# Patient Record
Sex: Female | Born: 1945 | Race: White | Hispanic: No | Marital: Married | Smoking: Former smoker
Health system: Southern US, Community
[De-identification: ages and names within clinical notes are randomized; demographics above are authoritative.]

## PROBLEM LIST (undated history)

## (undated) DIAGNOSIS — N289 Disorder of kidney and ureter, unspecified: Secondary | ICD-10-CM

## (undated) DIAGNOSIS — K5790 Diverticulosis of intestine, part unspecified, without perforation or abscess without bleeding: Secondary | ICD-10-CM

## (undated) DIAGNOSIS — M549 Dorsalgia, unspecified: Secondary | ICD-10-CM

## (undated) DIAGNOSIS — D649 Anemia, unspecified: Secondary | ICD-10-CM

## (undated) DIAGNOSIS — G629 Polyneuropathy, unspecified: Secondary | ICD-10-CM

## (undated) DIAGNOSIS — E7800 Pure hypercholesterolemia, unspecified: Secondary | ICD-10-CM

## (undated) DIAGNOSIS — I10 Essential (primary) hypertension: Secondary | ICD-10-CM

## (undated) DIAGNOSIS — E538 Deficiency of other specified B group vitamins: Secondary | ICD-10-CM

## (undated) DIAGNOSIS — D569 Thalassemia, unspecified: Secondary | ICD-10-CM

## (undated) HISTORY — PX: VAGINAL HYSTERECTOMY: SUR661

## (undated) HISTORY — PX: CHOLECYSTECTOMY: SHX55

---

## 2002-01-25 ENCOUNTER — Encounter: Payer: Self-pay | Admitting: Obstetrics and Gynecology

## 2002-01-25 ENCOUNTER — Encounter: Admission: RE | Admit: 2002-01-25 | Discharge: 2002-01-25 | Payer: Self-pay | Admitting: Obstetrics and Gynecology

## 2002-01-27 ENCOUNTER — Other Ambulatory Visit: Admission: RE | Admit: 2002-01-27 | Discharge: 2002-01-27 | Payer: Self-pay | Admitting: Obstetrics and Gynecology

## 2002-02-08 ENCOUNTER — Ambulatory Visit (HOSPITAL_COMMUNITY): Admission: RE | Admit: 2002-02-08 | Discharge: 2002-02-08 | Payer: Self-pay | Admitting: Obstetrics and Gynecology

## 2003-02-03 ENCOUNTER — Ambulatory Visit (HOSPITAL_COMMUNITY): Admission: RE | Admit: 2003-02-03 | Discharge: 2003-02-03 | Payer: Self-pay | Admitting: Gastroenterology

## 2003-03-21 ENCOUNTER — Other Ambulatory Visit: Admission: RE | Admit: 2003-03-21 | Discharge: 2003-03-21 | Payer: Self-pay | Admitting: Obstetrics and Gynecology

## 2003-08-24 ENCOUNTER — Observation Stay (HOSPITAL_COMMUNITY): Admission: AD | Admit: 2003-08-24 | Discharge: 2003-08-25 | Payer: Self-pay | Admitting: Obstetrics and Gynecology

## 2004-04-05 ENCOUNTER — Other Ambulatory Visit: Admission: RE | Admit: 2004-04-05 | Discharge: 2004-04-05 | Payer: Self-pay | Admitting: Obstetrics and Gynecology

## 2004-05-16 ENCOUNTER — Encounter: Admission: RE | Admit: 2004-05-16 | Discharge: 2004-05-16 | Payer: Self-pay | Admitting: Geriatric Medicine

## 2005-06-02 ENCOUNTER — Other Ambulatory Visit: Admission: RE | Admit: 2005-06-02 | Discharge: 2005-06-02 | Payer: Self-pay | Admitting: Obstetrics and Gynecology

## 2005-08-02 ENCOUNTER — Emergency Department (HOSPITAL_COMMUNITY): Admission: EM | Admit: 2005-08-02 | Discharge: 2005-08-02 | Payer: Self-pay | Admitting: Emergency Medicine

## 2005-10-04 ENCOUNTER — Emergency Department (HOSPITAL_COMMUNITY): Admission: EM | Admit: 2005-10-04 | Discharge: 2005-10-04 | Payer: Self-pay | Admitting: Emergency Medicine

## 2007-03-04 ENCOUNTER — Encounter: Admission: RE | Admit: 2007-03-04 | Discharge: 2007-03-04 | Payer: Self-pay | Admitting: Family Medicine

## 2011-10-07 ENCOUNTER — Institutional Professional Consult (permissible substitution): Payer: Self-pay | Admitting: Internal Medicine

## 2012-01-08 ENCOUNTER — Other Ambulatory Visit: Payer: Self-pay | Admitting: Nephrology

## 2012-01-08 DIAGNOSIS — N189 Chronic kidney disease, unspecified: Secondary | ICD-10-CM

## 2012-01-14 ENCOUNTER — Other Ambulatory Visit: Payer: Self-pay

## 2012-01-19 ENCOUNTER — Ambulatory Visit
Admission: RE | Admit: 2012-01-19 | Discharge: 2012-01-19 | Disposition: A | Discharge status: Home / Self Care | Payer: BC Managed Care – PPO | Source: Ambulatory Visit | Attending: Nephrology | Admitting: Nephrology

## 2012-01-19 DIAGNOSIS — N189 Chronic kidney disease, unspecified: Secondary | ICD-10-CM

## 2012-02-16 ENCOUNTER — Other Ambulatory Visit: Payer: Self-pay | Admitting: Gastroenterology

## 2012-03-01 ENCOUNTER — Other Ambulatory Visit (HOSPITAL_COMMUNITY): Payer: Self-pay | Admitting: Gastroenterology

## 2012-03-01 DIAGNOSIS — Z86010 Personal history of colonic polyps: Secondary | ICD-10-CM

## 2012-03-29 ENCOUNTER — Ambulatory Visit (HOSPITAL_COMMUNITY)
Admission: RE | Admit: 2012-03-29 | Discharge: 2012-03-29 | Disposition: A | Discharge status: Home / Self Care | Payer: BC Managed Care – PPO | Source: Ambulatory Visit | Attending: Gastroenterology | Admitting: Gastroenterology

## 2012-03-29 DIAGNOSIS — K5730 Diverticulosis of large intestine without perforation or abscess without bleeding: Secondary | ICD-10-CM | POA: Insufficient documentation

## 2012-03-29 DIAGNOSIS — Z86010 Personal history of colonic polyps: Secondary | ICD-10-CM

## 2013-02-21 ENCOUNTER — Other Ambulatory Visit: Payer: Self-pay | Admitting: Chiropractic Medicine

## 2013-02-21 ENCOUNTER — Ambulatory Visit
Admission: RE | Admit: 2013-02-21 | Discharge: 2013-02-21 | Disposition: A | Discharge status: Home / Self Care | Payer: BC Managed Care – PPO | Source: Ambulatory Visit | Attending: Chiropractic Medicine | Admitting: Chiropractic Medicine

## 2013-02-21 DIAGNOSIS — M545 Low back pain, unspecified: Secondary | ICD-10-CM

## 2013-02-21 DIAGNOSIS — M542 Cervicalgia: Secondary | ICD-10-CM

## 2014-02-17 ENCOUNTER — Other Ambulatory Visit: Payer: Self-pay | Admitting: Orthopaedic Surgery

## 2014-02-17 DIAGNOSIS — M545 Low back pain, unspecified: Secondary | ICD-10-CM

## 2014-02-22 ENCOUNTER — Ambulatory Visit
Admission: RE | Admit: 2014-02-22 | Discharge: 2014-02-22 | Disposition: A | Discharge status: Home / Self Care | Payer: Medicare HMO | Source: Ambulatory Visit | Attending: Orthopaedic Surgery | Admitting: Orthopaedic Surgery

## 2014-02-22 DIAGNOSIS — M545 Low back pain, unspecified: Secondary | ICD-10-CM

## 2014-05-05 ENCOUNTER — Encounter (HOSPITAL_COMMUNITY): Payer: Self-pay | Admitting: Emergency Medicine

## 2014-05-05 ENCOUNTER — Emergency Department (HOSPITAL_COMMUNITY)
Admission: EM | Admit: 2014-05-05 | Discharge: 2014-05-05 | Disposition: A | Discharge status: Home / Self Care | Payer: Medicare HMO | Attending: Emergency Medicine | Admitting: Emergency Medicine

## 2014-05-05 DIAGNOSIS — Z8719 Personal history of other diseases of the digestive system: Secondary | ICD-10-CM | POA: Insufficient documentation

## 2014-05-05 DIAGNOSIS — Z862 Personal history of diseases of the blood and blood-forming organs and certain disorders involving the immune mechanism: Secondary | ICD-10-CM | POA: Insufficient documentation

## 2014-05-05 DIAGNOSIS — M79609 Pain in unspecified limb: Secondary | ICD-10-CM

## 2014-05-05 DIAGNOSIS — G608 Other hereditary and idiopathic neuropathies: Secondary | ICD-10-CM | POA: Insufficient documentation

## 2014-05-05 DIAGNOSIS — R011 Cardiac murmur, unspecified: Secondary | ICD-10-CM | POA: Insufficient documentation

## 2014-05-05 DIAGNOSIS — Z8639 Personal history of other endocrine, nutritional and metabolic disease: Secondary | ICD-10-CM | POA: Insufficient documentation

## 2014-05-05 DIAGNOSIS — I82811 Embolism and thrombosis of superficial veins of right lower extremities: Secondary | ICD-10-CM

## 2014-05-05 DIAGNOSIS — I824Z9 Acute embolism and thrombosis of unspecified deep veins of unspecified distal lower extremity: Secondary | ICD-10-CM | POA: Insufficient documentation

## 2014-05-05 DIAGNOSIS — Z87448 Personal history of other diseases of urinary system: Secondary | ICD-10-CM | POA: Insufficient documentation

## 2014-05-05 DIAGNOSIS — I10 Essential (primary) hypertension: Secondary | ICD-10-CM | POA: Insufficient documentation

## 2014-05-05 HISTORY — DX: Thalassemia, unspecified: D56.9

## 2014-05-05 HISTORY — DX: Disorder of kidney and ureter, unspecified: N28.9

## 2014-05-05 HISTORY — DX: Anemia, unspecified: D64.9

## 2014-05-05 HISTORY — DX: Diverticulosis of intestine, part unspecified, without perforation or abscess without bleeding: K57.90

## 2014-05-05 HISTORY — DX: Pure hypercholesterolemia, unspecified: E78.00

## 2014-05-05 HISTORY — DX: Dorsalgia, unspecified: M54.9

## 2014-05-05 HISTORY — DX: Deficiency of other specified B group vitamins: E53.8

## 2014-05-05 HISTORY — DX: Polyneuropathy, unspecified: G62.9

## 2014-05-05 HISTORY — DX: Essential (primary) hypertension: I10

## 2014-05-05 LAB — CBC WITH DIFFERENTIAL/PLATELET
BASOS ABS: 0 10*3/uL (ref 0.0–0.1)
Basophils Relative: 0 % (ref 0–1)
EOS ABS: 0.3 10*3/uL (ref 0.0–0.7)
Eosinophils Relative: 4 % (ref 0–5)
HCT: 28.1 % — ABNORMAL LOW (ref 36.0–46.0)
Hemoglobin: 8.8 g/dL — ABNORMAL LOW (ref 12.0–15.0)
LYMPHS ABS: 2.6 10*3/uL (ref 0.7–4.0)
Lymphocytes Relative: 34 % (ref 12–46)
MCH: 18.6 pg — AB (ref 26.0–34.0)
MCHC: 31.3 g/dL (ref 30.0–36.0)
MCV: 59.5 fL — AB (ref 78.0–100.0)
MONO ABS: 0.4 10*3/uL (ref 0.1–1.0)
Monocytes Relative: 5 % (ref 3–12)
NEUTROS PCT: 57 % (ref 43–77)
Neutro Abs: 4.4 10*3/uL (ref 1.7–7.7)
PLATELETS: 255 10*3/uL (ref 150–400)
RBC: 4.72 MIL/uL (ref 3.87–5.11)
RDW: 16.5 % — AB (ref 11.5–15.5)
WBC: 7.7 10*3/uL (ref 4.0–10.5)

## 2014-05-05 LAB — BASIC METABOLIC PANEL
BUN: 39 mg/dL — AB (ref 6–23)
CO2: 22 mEq/L (ref 19–32)
CREATININE: 1.77 mg/dL — AB (ref 0.50–1.10)
Calcium: 9.8 mg/dL (ref 8.4–10.5)
Chloride: 105 mEq/L (ref 96–112)
GFR, EST AFRICAN AMERICAN: 33 mL/min — AB (ref >90)
GFR, EST NON AFRICAN AMERICAN: 29 mL/min — AB (ref >90)
GLUCOSE: 133 mg/dL — AB (ref 70–99)
Potassium: 4.4 mEq/L (ref 3.7–5.3)
Sodium: 142 mEq/L (ref 137–147)

## 2014-05-05 LAB — PROTIME-INR
INR: 1.06 (ref 0.00–1.49)
PROTHROMBIN TIME: 13.6 s (ref 11.6–15.2)

## 2014-05-05 NOTE — ED Notes (Signed)
She has felt pain to L inner calf since yesterday, then this am she noticed redness to the area and she wants to be checked for a blood clot

## 2014-05-05 NOTE — Discharge Instructions (Signed)
Phlebitis  Phlebitis is soreness and puffiness (swelling) in a vein.   HOME CARE  · Only take medicine as told by your doctor.  · Raise (elevate) the affected limb on a pillow as told by your doctor.  · Keep a warm packs on the affected vein as told by your doctor. Do not sleep with a heating pad.  · Use special stockings or bandages around the area of the affected vein as told by your doctor. These will speed healing and keep the condition from coming back.  · Talk to your doctor about all the medicines you take.  · Get follow-up blood tests as told by your doctor.  · If the phlebitis is in your legs:  · Avoid standing or resting for long periods.  · Keep your legs moving. Raise your legs when you sit or lie.  · Do not smoke.  · Follow-up with your doctor as told.  GET HELP IF:  · You have strange bruises or bleeding.  · Your puffiness or pain in the affected area is not getting better.  · You are taking special medicine to lessen puffiness (anti-inflammatory medicine) , and you get belly pain.  GET HELP RIGHT AWAY IF:   · The phlebitis gets worse and you have more pain, puffiness (swelling), or redness.  · You have trouble breathing or have chest pain.  · You have a fever.  MAKE SURE YOU:   · Understand these instructions.  · Will watch your condition.  · Will get help right away if you are not doing well or get worse.  Document Released: 11/26/2009 Document Revised: 09/28/2013 Document Reviewed: 08/15/2013  [employer]® Patient Information ©2014 [employer].

## 2014-05-05 NOTE — Progress Notes (Signed)
VASCULAR LAB PRELIMINARY  PRELIMINARY  PRELIMINARY  PRELIMINARY  Right lower extremity venous duplex completed.    Preliminary report:  Superficial thrombosis noted in a branch of the GSV at the area of redness/tenderness.  No evidence of DVT     Gara KronerHelene F Yemaya Barnier, RVT 05/05/2014, 2:26 PM

## 2014-05-05 NOTE — ED Provider Notes (Signed)
CSN: [id number]     Arrival date & time 05/05/14  1139 History   First MD Initiated Contact with Patient 05/05/14 1154     Chief Complaint  Patient presents with  . Leg Pain     (Consider location/radiation/quality/duration/timing/severity/associated sxs/prior Treatment) HPI 68 year old female with reddened, tender area to right medial calf. She states it began yesterday. She has not noted that it has spread. She does not know of any trauma or insect bites. She has not had any similar episodes in the past. She denies any history of DVT into any chest pain or shortness of breath. She has not treated it in anyway and comes in stating that she initially noted pain there. Past Medical History  Diagnosis Date  . Hypertension   . Hypercholesteremia   . Peripheral neuropathy   . Diverticulosis   . Anemia   . Renal disorder   . Thalassemia   . Vitamin B 12 deficiency   . Back pain    History reviewed. No pertinent past surgical history. History reviewed. No pertinent family history. History  Substance Use Topics  . Smoking status: Never Smoker   . Smokeless tobacco: Not on file  . Alcohol Use: No   OB History   Grav Para Term Preterm Abortions TAB SAB Ect Mult Living                 Review of Systems  All other systems reviewed and are negative.     Allergies  Review of patient's allergies indicates no known allergies.  Home Medications   Prior to Admission medications   Not on File   BP 128/61  Pulse 78  Temp(Src) 97.9 F (36.6 C) (Oral)  Resp 20  SpO2 99% Physical Exam  Nursing note and vitals reviewed. Constitutional: She appears well-developed and well-nourished.  HENT:  Head: Normocephalic and atraumatic.  Right Ear: External ear normal.  Left Ear: External ear normal.  Nose: Nose normal.  Mouth/Throat: Oropharynx is clear and moist.  Eyes: Conjunctivae are normal. Pupils are equal, round, and reactive to light.  Neck: Normal range of motion. Neck  supple.  Cardiovascular: Normal rate and regular rhythm.   Murmur heard. Pulmonary/Chest: Effort normal and breath sounds normal.  Abdominal: Soft. Bowel sounds are normal.  Musculoskeletal: She exhibits tenderness. She exhibits no edema.       Legs:   ED Course  Procedures (including critical care time) Labs Review Labs Reviewed  CBC WITH DIFFERENTIAL - Abnormal; Notable for the following:    Hemoglobin 8.8 (*)    HCT 28.1 (*)    MCV 59.5 (*)    MCH 18.6 (*)    RDW 16.5 (*)    All other components within normal limits  BASIC METABOLIC PANEL - Abnormal; Notable for the following:    Glucose, Bld 133 (*)    BUN 39 (*)    Creatinine, Ser 1.77 (*)    GFR calc non Af Amer 29 (*)    GFR calc Af Amer 33 (*)    All other components within normal limits  PROTIME-INR    Imaging Review No results found.   EKG Interpretation None      MDM   Final diagnoses:  Superficial thrombosis of right lower extremity    Superficial vein thrombosis of branch of GSV with no dvt.  Plan treatment with warm compresses, elevation and close follow up for repeat doppler.  Anemia- discussed with patient - states near baseline and will follow up with  pmd  Hilario Quarryanielle S Iver Fehrenbach, MD 05/06/14 [phone]

## 2014-06-24 ENCOUNTER — Encounter (HOSPITAL_COMMUNITY): Payer: Self-pay | Admitting: Emergency Medicine

## 2014-06-24 ENCOUNTER — Emergency Department (HOSPITAL_COMMUNITY)
Admission: EM | Admit: 2014-06-24 | Discharge: 2014-06-24 | Disposition: A | Discharge status: Home / Self Care | Payer: Medicare HMO | Attending: Emergency Medicine | Admitting: Emergency Medicine

## 2014-06-24 DIAGNOSIS — Z79899 Other long term (current) drug therapy: Secondary | ICD-10-CM | POA: Insufficient documentation

## 2014-06-24 DIAGNOSIS — N289 Disorder of kidney and ureter, unspecified: Secondary | ICD-10-CM | POA: Insufficient documentation

## 2014-06-24 DIAGNOSIS — G609 Hereditary and idiopathic neuropathy, unspecified: Secondary | ICD-10-CM | POA: Insufficient documentation

## 2014-06-24 DIAGNOSIS — T7840XA Allergy, unspecified, initial encounter: Secondary | ICD-10-CM

## 2014-06-24 DIAGNOSIS — Z8719 Personal history of other diseases of the digestive system: Secondary | ICD-10-CM | POA: Insufficient documentation

## 2014-06-24 DIAGNOSIS — E7800 Pure hypercholesterolemia, unspecified: Secondary | ICD-10-CM | POA: Insufficient documentation

## 2014-06-24 DIAGNOSIS — Z862 Personal history of diseases of the blood and blood-forming organs and certain disorders involving the immune mechanism: Secondary | ICD-10-CM | POA: Insufficient documentation

## 2014-06-24 DIAGNOSIS — L272 Dermatitis due to ingested food: Secondary | ICD-10-CM | POA: Insufficient documentation

## 2014-06-24 DIAGNOSIS — I10 Essential (primary) hypertension: Secondary | ICD-10-CM | POA: Insufficient documentation

## 2014-06-24 MED ORDER — FAMOTIDINE IN NACL 20-0.9 MG/50ML-% IV SOLN
20.0000 mg | Freq: Once | INTRAVENOUS | Status: AC
  Administered 2014-06-24: 20 mg via INTRAVENOUS
  Filled 2014-06-24: qty 50

## 2014-06-24 MED ORDER — PREDNISONE 20 MG PO TABS
60.0000 mg | ORAL_TABLET | Freq: Once | ORAL | Status: AC
Start: 2014-06-24 — End: 2014-06-24
  Administered 2014-06-24: 60 mg via ORAL
  Filled 2014-06-24: qty 3

## 2014-06-24 MED ORDER — EPINEPHRINE 0.3 MG/0.3ML IJ SOAJ: 0.3000 mg | INTRAMUSCULAR | Status: DC | PRN

## 2014-06-24 NOTE — Discharge Instructions (Signed)

## 2014-06-24 NOTE — ED Notes (Signed)
The pt was at a church picnic and suddenly she  Had a red flush all over her body  Itching and feeling pins and needles.  Her throat feel full  And she feel like her breath is getting short with pins and needles over her body.  She had  25mg  of benadryl approx 20 minutes ago

## 2014-06-24 NOTE — ED Notes (Signed)
Pt states she ate dinner at church function tonight, then began having trouble breathing and itching all over. Upon arrival pt also states hives to bilateral legs and feeling of throat closing up. Respirations unlabored. Lung sounds clear and equal bilaterally. Skin warm and dry. Pt is alert and oriented x4.

## 2014-06-24 NOTE — ED Provider Notes (Signed)
CSN: [id number]     Arrival date & time 06/24/14  1754 History   First MD Initiated Contact with Patient 06/24/14 1812     Chief Complaint  Patient presents with  . Allergic Reaction   68 y/o female with PMH HTN that presents with an allergic reaction from a church picnic. Patient states that she has a variety of foods and then had a sudden onset of urticaria rash and feeling that her tongue was swelling 45 min prior to arrival. The patient took 25mg  on benadryl before arrival and states that since the rash has subsided but she still feels tongue swelling. The patient is able to handle her own secretions, is talking easily and is not having chest pain or SOB.   (Consider location/radiation/quality/duration/timing/severity/associated sxs/prior Treatment) Patient is a 68 y.o. female presenting with allergic reaction.  Allergic Reaction Presenting symptoms: itching, rash and swelling   Itching:    Location:  Full body Severity:  Moderate Prior allergic episodes:  No prior episodes Context: food   Relieved by:  Antihistamines Worsened by:  Nothing tried   Past Medical History  Diagnosis Date  . Hypertension   . Hypercholesteremia   . Peripheral neuropathy   . Diverticulosis   . Anemia   . Renal disorder   . Thalassemia   . Vitamin B 12 deficiency   . Back pain    History reviewed. No pertinent past surgical history. No family history on file. History  Substance Use Topics  . Smoking status: Never Smoker   . Smokeless tobacco: Not on file  . Alcohol Use: No   OB History   Grav Para Term Preterm Abortions TAB SAB Ect Mult Living                 Review of Systems  Constitutional: Negative for activity change.  HENT: Negative for congestion.   Respiratory: Negative for cough and shortness of breath.   Cardiovascular: Negative for chest pain and leg swelling.  Gastrointestinal: Negative for nausea, vomiting, abdominal pain, diarrhea, constipation, blood in stool and  abdominal distention.  Genitourinary: Negative for dysuria, flank pain and vaginal discharge.  Musculoskeletal: Negative for back pain.  Skin: Positive for itching and rash. Negative for color change.  Neurological: Negative for syncope and headaches.  Psychiatric/Behavioral: Negative for agitation.      Allergies  Review of patient's allergies indicates no known allergies.  Home Medications   Prior to Admission medications   Medication Sig Start Date End Date Taking? Authorizing Provider  allopurinol (ZYLOPRIM) 100 MG tablet Take 200 mg by mouth daily.   Yes Historical Provider, MD  amLODipine (NORVASC) 10 MG tablet Take 10 mg by mouth daily.   Yes Historical Provider, MD  fenofibrate 160 MG tablet Take 160 mg by mouth daily.   Yes Historical Provider, MD  gabapentin (NEURONTIN) 300 MG capsule Take 300-600 mg by mouth 2 (two) times daily. Take 1 capsule (300 mg) in the morning and take 2 capsules (600 mg) at bedtime)   Yes Historical Provider, MD  hydrochlorothiazide (HYDRODIURIL) 25 MG tablet Take 25 mg by mouth daily.   Yes Historical Provider, MD  irbesartan (AVAPRO) 300 MG tablet Take 300 mg by mouth daily.   Yes Historical Provider, MD  magnesium gluconate (MAGONATE) 500 MG tablet Take 500 mg by mouth daily.   Yes Historical Provider, MD  montelukast (SINGULAIR) 10 MG tablet Take 10 mg by mouth daily.   Yes Historical Provider, MD  OVER THE COUNTER MEDICATION Place  1 drop into both eyes at bedtime. For dry eyes   Yes Historical Provider, MD   BP 140/56  Pulse 84  Temp(Src) 98.8 F (37.1 C) (Oral)  Resp 22  Wt 219 lb 6 oz (99.508 kg)  SpO2 98% Physical Exam  Constitutional: She is oriented to person, place, and time. She appears well-developed.  HENT:  Head: Normocephalic.  Mild swelling of tongue   Eyes: Pupils are equal, round, and reactive to light.  Neck: Neck supple.  Cardiovascular: Normal rate.  Exam reveals no gallop and no friction rub.   No murmur  heard. Pulmonary/Chest: Effort normal and breath sounds normal. No respiratory distress.  Abdominal: Soft. She exhibits no distension. There is no tenderness. There is no rebound.  Musculoskeletal: She exhibits no edema.  Neurological: She is alert and oriented to person, place, and time.  Skin: Skin is warm.  Rash has resolved   Psychiatric: She has a normal mood and affect.    ED Course  Procedures (including critical care time) Labs Review Labs Reviewed - No data to display  Imaging Review No results found.   EKG Interpretation None      MDM   Final diagnoses:  Allergic reaction, initial encounter   68 y/o female presents with allergic reaction from a picinic where she ate multiple different foods. Patient described a diffuse uticaric rash that has since resolved after she took benadryl 25mg  before arrival. Patient states that she feels like he tongue is swelling but no difficulty swallowing or breathing.  On physical exam the rash is no longer present, mild tongue swelling noted, patient is in no acute distress. Epi not indicated at this time.  Patient treated with the following:  Medications  predniSONE (DELTASONE) tablet 60 mg (60 mg Oral Given 06/24/14 1838)  famotidine (PEPCID) IVPB 20 mg (0 mg Intravenous Stopped 06/24/14 1951)   She had improvement and states that she no longer feels the tongue swelling. She was was prescribed an epi pen in case of severe reoccurrence and was then discharged home with return precautions given.     Clement SayresStaci Stacie Templin, MD 06/25/14 1254

## 2014-06-26 NOTE — ED Provider Notes (Signed)
Medical screening examination/treatment/procedure(s) were conducted as a shared visit with resident physician and myself.  I personally evaluated the patient during the encounter.   I interviewed and examined the patient. Lungs are CTAB. Cardiac exam wnl. Abdomen soft.  Pt asx on my exam. Normal appearing tongue. No rash. She was monitored in the ED. No recurrence of sx. Will rec benadryl prn and epi pen for home.   Junius ArgyleForrest S Marty Sadlowski, MD 06/26/14 2105

## 2014-11-09 ENCOUNTER — Other Ambulatory Visit: Payer: Self-pay | Admitting: Orthopaedic Surgery

## 2014-11-09 DIAGNOSIS — M5030 Other cervical disc degeneration, unspecified cervical region: Secondary | ICD-10-CM

## 2014-11-21 ENCOUNTER — Ambulatory Visit
Admission: RE | Admit: 2014-11-21 | Discharge: 2014-11-21 | Disposition: A | Discharge status: Home / Self Care | Payer: Commercial Managed Care - HMO | Source: Ambulatory Visit | Attending: Orthopaedic Surgery | Admitting: Orthopaedic Surgery

## 2014-11-21 DIAGNOSIS — M5030 Other cervical disc degeneration, unspecified cervical region: Secondary | ICD-10-CM

## 2017-03-25 ENCOUNTER — Encounter: Payer: Self-pay | Admitting: Internal Medicine

## 2017-03-25 ENCOUNTER — Ambulatory Visit (INDEPENDENT_AMBULATORY_CARE_PROVIDER_SITE_OTHER): Payer: Medicare HMO | Admitting: Internal Medicine

## 2017-03-25 VITALS — BP 100/52 | HR 70 | Ht 65.5 in | Wt 225.4 lb

## 2017-03-25 DIAGNOSIS — J310 Chronic rhinitis: Secondary | ICD-10-CM | POA: Diagnosis not present

## 2017-03-25 DIAGNOSIS — G4733 Obstructive sleep apnea (adult) (pediatric): Secondary | ICD-10-CM | POA: Insufficient documentation

## 2017-03-25 NOTE — Progress Notes (Signed)
03/25/2814-71 year old female former smoker Referred  by Charlies Silvers, PA.    Pt states she is a bad snorer and can not breathe through her nose. Pt per her husband see her stop breathing while sleeping. That has been ongoing for  several years Medical problem list includes HBP, anemia/thalassemia/B12 deficiency She reports feeling unrested with no energy or stamina, easily drowsy if she sits quietly. Denies problems driving or if she is actively engaged. Little caffeine. 20 pound weight gain since she retired recently. Chronic stuffy nose with no trauma and no ENT surgery. Mild aortic stenosis. No lung or thyroid problems. Sleep is not disturbed by body movements but she reports nocturia 2 or 3. Very short sleep latency around 10 or 11 PM then up between 8 and 8:30 AM. No sleep medicines.  Prior to Admission medications   Medication Sig Start Date End Date Taking? Authorizing Provider  allopurinol (ZYLOPRIM) 100 MG tablet Take 200 mg by mouth daily.   Yes Historical Provider, MD  amLODipine (NORVASC) 10 MG tablet Take 10 mg by mouth daily.   Yes Historical Provider, MD  aspirin 81 MG chewable tablet Chew by mouth daily.   Yes Historical Provider, MD  fenofibrate 160 MG tablet Take 160 mg by mouth daily.   Yes Historical Provider, MD  gabapentin (NEURONTIN) 300 MG capsule Take 300-600 mg by mouth 2 (two) times daily. Take 1 capsule (300 mg) in the morning and take 2 capsules (600 mg) at bedtime)   Yes Historical Provider, MD  irbesartan (AVAPRO) 300 MG tablet Take 300 mg by mouth daily.   Yes Historical Provider, MD  montelukast (SINGULAIR) 10 MG tablet Take 10 mg by mouth daily.   Yes Historical Provider, MD  OVER THE COUNTER MEDICATION Place 1 drop into both eyes at bedtime. For dry eyes   Yes Historical Provider, MD  EPINEPHrine (EPIPEN) 0.3 mg/0.3 mL IJ SOAJ injection Inject 0.3 mLs (0.3 mg total) into the muscle as needed. Patient not taking: Reported on 03/25/2017 06/24/14   Clement Sayres, MD   furosemide (LASIX) 40 MG tablet Take 40 mg by mouth.    Historical Provider, MD  hydrochlorothiazide (HYDRODIURIL) 25 MG tablet Take 25 mg by mouth daily.    Historical Provider, MD  magnesium gluconate (MAGONATE) 500 MG tablet Take 500 mg by mouth daily.    Historical Provider, MD   Past Medical History:  Diagnosis Date  . Anemia   . Back pain   . Diverticulosis   . Hypercholesteremia   . Hypertension   . Peripheral neuropathy (HCC)   . Renal disorder   . Thalassemia   . Vitamin B 12 deficiency    No past surgical history on file. No family history on file. Social History   Social History  . Marital status: Married    Spouse name: N/A  . Number of children: N/A  . Years of education: N/A   Occupational History  . Not on file.   Social History Main Topics  . Smoking status: Former Smoker    Packs/day: 0.50    Quit date: 12/22/1990  . Smokeless tobacco: Never Used  . Alcohol use No  . Drug use: No  . Sexual activity: Not on file   Other Topics Concern  . Not on file   Social History Narrative  . No narrative on file   ROS-see HPI    "+" = pos Constitutional:    weight loss, night sweats, fevers, chills, +fatigue, lassitude. HEENT:    headaches,  difficulty swallowing, tooth/dental problems, sore throat,       sneezing, itching, ear ache, nasal congestion, post nasal drip, snoring CV:    chest pain, orthopnea, PND, swelling in lower extremities, anasarca,                                                         dizziness, palpitations Resp:   shortness of breath with exertion or at rest.                productive cough,   non-productive cough, coughing up of blood.              change in color of mucus.  wheezing.   Skin:    rash or lesions. GI:  No-   heartburn, indigestion, abdominal pain, nausea, vomiting, diarrhea,                 change in bowel habits, loss of appetite GU: dysuria, change in color of urine, no urgency or frequency.   flank pain. MS:   joint  pain, stiffness, decreased range of motion, back pain. Neuro-     nothing unusual Psych:  change in mood or affect.  depression or anxiety.   memory loss.  OBJ- Physical Exam General- Alert, Oriented, Affect-appropriate, Distress- none acute, + Overweight Skin- rash-none, lesions- none, excoriation- none Lymphadenopathy- none Head- atraumatic            Eyes- Gross vision intact, PERRLA, conjunctivae and secretions clear            Ears- Hearing, canals-normal            Nose- + obstructing granulation/polypoid material and clot L, + septal perforation, mucus, erosion,             Throat- Mallampati IV , mucosa clear , drainage- none, tonsils- atrophic Neck- flexible , trachea midline, no stridor , thyroid nl, carotid no bruit Chest - symmetrical excursion , unlabored           Heart/CV- RRR ,  Murmur +1/6 AS LUSB , no gallop  , no rub, nl s1 s2                           - JVD- none , edema- none, stasis changes- none, varices- none           Lung- clear to P&A, wheeze- none, cough- none , dullness-none, rub- none           Chest [place]-  Abd-  Br/ Gen/ Rectal- Not done, not indicated Extrem- cyanosis- none, clubbing, none, atrophy- none, strength- nl Neuro- grossly intact to observation

## 2017-03-25 NOTE — Patient Instructions (Addendum)
Order- schedule unattended home sleep test   Dx OSA  Please call us a week or so after you do the sleep test, so we can discuss results and maybe order CPAP as a start.

## 2017-03-25 NOTE — Assessment & Plan Note (Signed)
High probability on history and exam. Weight loss encouraged. Her responsibility to drive safely emphasized. Basic medical concerns of untreated sleep apnea, alkaline of testing and brief discussion of available treatments outlined. Plan-schedule unattended home sleep test

## 2017-03-25 NOTE — Assessment & Plan Note (Signed)
There is an obstructive process especially in the left nostril associated with nasal septal perforation. She will need ENT evaluation after we get the sleep study results. I explained that an improved nasal airway would reduce sleep-disordered breathing- associated problems

## 2017-04-15 DIAGNOSIS — G4733 Obstructive sleep apnea (adult) (pediatric): Secondary | ICD-10-CM | POA: Diagnosis not present

## 2017-04-22 DIAGNOSIS — G4733 Obstructive sleep apnea (adult) (pediatric): Secondary | ICD-10-CM | POA: Diagnosis not present

## 2017-04-24 ENCOUNTER — Other Ambulatory Visit: Payer: Self-pay | Admitting: *Deleted

## 2017-04-24 DIAGNOSIS — G4733 Obstructive sleep apnea (adult) (pediatric): Secondary | ICD-10-CM

## 2017-04-29 ENCOUNTER — Encounter: Payer: Self-pay | Admitting: Internal Medicine

## 2017-04-29 ENCOUNTER — Ambulatory Visit (INDEPENDENT_AMBULATORY_CARE_PROVIDER_SITE_OTHER): Payer: Medicare HMO | Admitting: Internal Medicine

## 2017-04-29 VITALS — BP 120/78 | HR 67 | Ht 65.5 in | Wt 221.6 lb

## 2017-04-29 DIAGNOSIS — G4733 Obstructive sleep apnea (adult) (pediatric): Secondary | ICD-10-CM | POA: Diagnosis not present

## 2017-04-29 NOTE — Assessment & Plan Note (Signed)
Sleep study demonstrates severe obstructive sleep apnea. We discussed treatment options and medical issues. Plan-begin CPAP auto 5-20

## 2017-04-29 NOTE — Patient Instructions (Addendum)
Order- new DME, new CPAP auto 5-20, mask of choice, humidifier, supplies, AirView     Dx OSA  Please call as needed   

## 2017-04-29 NOTE — Assessment & Plan Note (Signed)
Weight loss would substantially improve sleep apnea situation. I suspect the oxygen desaturation noted during her sleep study reflects obesity hypoventilation syndrome in this never smoker. Plan-encourage weight loss, CXR, consider bariatric referral option

## 2017-04-29 NOTE — Progress Notes (Signed)
03/25/2017-71 year old female former smoker Referred  by Charlies Silvers, PA.    Pt states she is a bad snorer and can not breathe through her nose. Pt per her husband see her stop breathing while sleeping. That has been ongoing for  several years Medical problem list includes HBP, anemia/thalassemia/B12 deficiency She reports feeling unrested with no energy or stamina, easily drowsy if she sits quietly. Denies problems driving or if she is actively engaged. Little caffeine. 20 pound weight gain since she retired recently. Chronic stuffy nose with no trauma and no ENT surgery. Mild aortic stenosis. No lung or thyroid problems. Sleep is not disturbed by body movements but she reports nocturia 2 or 3. Very short sleep latency around 10 or 11 PM then up between 8 and 8:30 AM. No sleep medicines.  04/29/17- 71 year old female former smoker followed for OSA, chronic rhinitis, complicated by aortic stenosis, thalassemia, B12 deficiency, HBP, peripheral neuropathy, gout, nasal septal perforation Unattended Home Sleep Test-04/15/17-AHI 41.8/hour, desaturation to 55% with mean saturation 87%, body weight 225 pounds FOLLOWS FOR: Review HST with patient. She mentions history of mild seasonal asthma with occasional mild wheezing-never much. No history of lung disease that would explain low oxygen saturations during her sleep study. Obesity hypoventilation is most likely explanation. Incidental complaint of gout in her foot today. Occasional small clot from nose.   ROS-see HPI    "+" = pos Constitutional:    weight loss, night sweats, fevers, chills, +fatigue, lassitude. HEENT:    headaches, difficulty swallowing, tooth/dental problems, sore throat,       sneezing, itching, ear ache, nasal congestion, post nasal drip, snoring CV:    chest pain, orthopnea, PND, swelling in lower extremities, anasarca,                                                         dizziness, palpitations Resp:   shortness of breath  with exertion or at rest.                productive cough,   non-productive cough, coughing up of blood.              change in color of mucus. + wheezing.   Skin:    rash or lesions. GI:  No-   heartburn, indigestion, abdominal pain, nausea, vomiting, diarrhea,                 change in bowel habits, loss of appetite GU: dysuria, change in color of urine, no urgency or frequency.   flank pain. MS:   +joint pain, stiffness, decreased range of motion, back pain. Neuro-     nothing unusual Psych:  change in mood or affect.  depression or anxiety.   memory loss.  OBJ- Physical Exam General- Alert, Oriented, Affect-appropriate, Distress- none acute, + obese Skin- rash-none, lesions- none, excoriation- none Lymphadenopathy- none Head- atraumatic            Eyes- Gross vision intact, PERRLA, conjunctivae and secretions clear            Ears- Hearing, canals-normal            Nose- + obstructing granulation/polypoid material and clot L, + septal perforation, mucus, erosion,             Throat- Mallampati IV , mucosa clear ,  drainage- none, tonsils- atrophic Neck- flexible , trachea midline, no stridor , thyroid nl, carotid no bruit Chest - symmetrical excursion , unlabored           Heart/CV- RRR ,  Murmur +1/6 AS LUSB , no gallop  , no rub, nl s1 s2                           - JVD- none , edema- none, stasis changes- none, varices- none           Lung- clear to P&A, wheeze- none, cough- none , dullness-none, rub- none           Chest [place]-  Abd-  Br/ Gen/ Rectal- Not done, not indicated Extrem- cyanosis- none, clubbing, none, atrophy- none, strength- nl Neuro- grossly intact to observation

## 2017-07-16 ENCOUNTER — Encounter: Payer: Medicare HMO | Admitting: Hematology

## 2017-07-22 ENCOUNTER — Ambulatory Visit (HOSPITAL_BASED_OUTPATIENT_CLINIC_OR_DEPARTMENT_OTHER): Payer: Medicare HMO | Admitting: Hematology

## 2017-07-22 ENCOUNTER — Ambulatory Visit (HOSPITAL_BASED_OUTPATIENT_CLINIC_OR_DEPARTMENT_OTHER): Payer: Medicare HMO

## 2017-07-22 ENCOUNTER — Telehealth: Payer: Self-pay | Admitting: Hematology

## 2017-07-22 VITALS — BP 130/59 | HR 73 | Temp 98.1°F | Resp 19 | Ht 65.5 in | Wt 229.9 lb

## 2017-07-22 DIAGNOSIS — D509 Iron deficiency anemia, unspecified: Secondary | ICD-10-CM

## 2017-07-22 DIAGNOSIS — E538 Deficiency of other specified B group vitamins: Secondary | ICD-10-CM

## 2017-07-22 DIAGNOSIS — D751 Secondary polycythemia: Secondary | ICD-10-CM | POA: Diagnosis not present

## 2017-07-22 LAB — CBC & DIFF AND RETIC
BASO%: 0.3 % (ref 0.0–2.0)
BASOS ABS: 0 10*3/uL (ref 0.0–0.1)
EOS%: 1.5 % (ref 0.0–7.0)
Eosinophils Absolute: 0.2 10*3/uL (ref 0.0–0.5)
HEMATOCRIT: 28.3 % — AB (ref 34.8–46.6)
HGB: 8.5 g/dL — ABNORMAL LOW (ref 11.6–15.9)
Immature Retic Fract: 16.3 % — ABNORMAL HIGH (ref 1.60–10.00)
LYMPH#: 3.4 10*3/uL — AB (ref 0.9–3.3)
LYMPH%: 27.8 % (ref 14.0–49.7)
MCH: 18.6 pg — AB (ref 25.1–34.0)
MCHC: 30 g/dL — AB (ref 31.5–36.0)
MCV: 62.1 fL — ABNORMAL LOW (ref 79.5–101.0)
MONO#: 0.8 10*3/uL (ref 0.1–0.9)
MONO%: 6.5 % (ref 0.0–14.0)
NEUT#: 7.8 10*3/uL — ABNORMAL HIGH (ref 1.5–6.5)
NEUT%: 63.9 % (ref 38.4–76.8)
PLATELETS: 219 10*3/uL (ref 145–400)
RBC: 4.56 10*6/uL (ref 3.70–5.45)
RDW: 17.1 % — AB (ref 11.2–14.5)
Retic %: 2.58 % — ABNORMAL HIGH (ref 0.70–2.10)
Retic Ct Abs: 117.65 10*3/uL — ABNORMAL HIGH (ref 33.70–90.70)
WBC: 12.3 10*3/uL — ABNORMAL HIGH (ref 3.9–10.3)

## 2017-07-22 LAB — COMPREHENSIVE METABOLIC PANEL
ALT: 47 U/L (ref 0–55)
ANION GAP: 10 meq/L (ref 3–11)
AST: 25 U/L (ref 5–34)
Albumin: 3.8 g/dL (ref 3.5–5.0)
Alkaline Phosphatase: 54 U/L (ref 40–150)
BUN: 73.9 mg/dL — ABNORMAL HIGH (ref 7.0–26.0)
CALCIUM: 10.2 mg/dL (ref 8.4–10.4)
CHLORIDE: 111 meq/L — AB (ref 98–109)
CO2: 23 meq/L (ref 22–29)
Creatinine: 1.6 mg/dL — ABNORMAL HIGH (ref 0.6–1.1)
EGFR: 32 mL/min/{1.73_m2} — AB (ref >90)
Glucose: 95 mg/dl (ref 70–140)
POTASSIUM: 4.3 meq/L (ref 3.5–5.1)
Sodium: 143 mEq/L (ref 136–145)
Total Bilirubin: 0.43 mg/dL (ref 0.20–1.20)
Total Protein: 6.8 g/dL (ref 6.4–8.3)

## 2017-07-22 LAB — IRON AND TIBC
%SAT: 20 % — AB (ref 21–57)
Iron: 85 ug/dL (ref 41–142)
TIBC: 425 ug/dL (ref 236–444)
UIBC: 340 ug/dL (ref 120–384)

## 2017-07-22 LAB — FERRITIN: FERRITIN: 224 ng/mL (ref 9–269)

## 2017-07-22 MED ORDER — CYANOCOBALAMIN 1000 MCG/ML IJ SOLN: 1000.0000 ug | INTRAMUSCULAR | 11 refills | Status: DC

## 2017-07-22 NOTE — Telephone Encounter (Signed)
Scheduled appt per 8/1 los - Gave patient AVS and calender per los.  

## 2017-07-22 NOTE — Progress Notes (Signed)
Marland Kitchen.    HEMATOLOGY/ONCOLOGY CONSULTATION NOTE  Date of Service: 07/22/2017  Patient Care Team: Charlies Silversouillard, Jennifer, PA-C as PCP - General (Physician Assistant)  CHIEF COMPLAINTS/PURPOSE OF CONSULTATION:  Microcytic Anemia  HISTORY OF PRESENTING ILLNESS:   Cristina Ali is a wonderful 71 y.o. female who has been referred to us by Dr .Charlies Silversouillard, Jennifer, PA-C  for evaluation and management of microcytic Anemia.  Patient has a h/o HTN,HLD, possible thalassemia, gout, B12 deficiency, CKD who on recent labs with her PCP was in mid June 2018 was noted to have anemia with a hgb of 8.2 and MCV of 60. She notes that she has had chronic anemia and her baseline hgb typically tends to be 8-10.  She reports that her mother had beta-thalassemia and sister and her son likely have thalassemia as well. She notes that she did need PRBC transfusion during pregnancy but has not been transfusion dependent otherwise.  She notes has mild fatigue. No CP/overt SOB or DOE. No dizziness or lightheadedness.  She is not replacement of B12 as per her PCP.  No fevers/chills/night sweats or unexpected weight loss  MEDICAL HISTORY:  Past Medical History:  Diagnosis Date  . Anemia   . Back pain   . Diverticulosis   . Hypercholesteremia   . Hypertension   . Peripheral neuropathy   . Renal disorder   . Thalassemia   . Vitamin B 12 deficiency     SURGICAL HISTORY: Past Surgical History:  Procedure Laterality Date  . CHOLECYSTECTOMY    . VAGINAL HYSTERECTOMY N/A     SOCIAL HISTORY: Social History   Social History  . Marital status: Married    Spouse name: N/A  . Number of children: N/A  . Years of education: N/A   Occupational History  . Not on file.   Social History Main Topics  . Smoking status: Former Smoker    Packs/day: 0.50    Quit date: 12/22/1990  . Smokeless tobacco: Never Used  . Alcohol use No  . Drug use: No  . Sexual activity: Not on file   Other Topics Concern  . Not on file    Social History Narrative  . No narrative on file    FAMILY HISTORY: No family history on file.  ALLERGIES:  has No Known Allergies.  MEDICATIONS:  Current Outpatient Prescriptions  Medication Sig Dispense Refill  . allopurinol (ZYLOPRIM) 100 MG tablet Take 300 mg by mouth daily.     Marland Kitchen. amLODipine (NORVASC) 10 MG tablet Take 10 mg by mouth daily.    Marland Kitchen. aspirin 81 MG chewable tablet Chew by mouth daily.    . carvedilol (COREG) 25 MG tablet 1/2 tablet every AM and 1 tablet every PM    . cyanocobalamin (,VITAMIN B-12,) 1000 MCG/ML injection Inject 1 mL (1,000 mcg total) into the skin every 30 (thirty) days. 1 mL 11  . fenofibrate 160 MG tablet Take 160 mg by mouth daily.    . furosemide (LASIX) 40 MG tablet Take 40 mg by mouth.    . gabapentin (NEURONTIN) 300 MG capsule Take 300-600 mg by mouth 2 (two) times daily. Take 1 capsule (300 mg) in the morning and take 2 capsules (600 mg) at bedtime)    . irbesartan (AVAPRO) 300 MG tablet Take 300 mg by mouth daily.    . montelukast (SINGULAIR) 10 MG tablet Take 10 mg by mouth daily.     No current facility-administered medications for this visit.     REVIEW OF SYSTEMS:  10 Point review of Systems was done is negative except as noted above.  PHYSICAL EXAMINATION: ECOG PERFORMANCE STATUS: 1 - Symptomatic but completely ambulatory  . Vitals:   07/22/17 1135  BP: (!) 130/59  Pulse: 73  Resp: 19  Temp: 98.1 F (36.7 C)   Filed Weights   07/22/17 1135  Weight: 229 lb 14.4 oz (104.3 kg)   .Body mass index is 37.68 kg/m.  GENERAL:alert, in no acute distress and comfortable SKIN: no acute rashes, no significant lesions EYES: conjunctiva are pink and non-injected, sclera anicteric OROPHARYNX: MMM, no exudates, no oropharyngeal erythema or ulceration NECK: supple, no JVD LYMPH:  no palpable lymphadenopathy in the cervical, axillary or inguinal regions LUNGS: clear to auscultation b/l with normal respiratory effort HEART:  regular rate & rhythm ABDOMEN:  normoactive bowel sounds , non tender, not distended. No palpable hepato-splenomegaly. Extremity: no pedal edema PSYCH: alert & oriented x 3 with fluent speech NEURO: no focal motor/sensory deficits  LABORATORY DATA:  I have reviewed the data as listed  . CBC Latest Ref Rng & Units 07/22/2017 05/05/2014  WBC 3.9 - 10.3 10e3/uL 12.3(H) 7.7  Hemoglobin 11.6 - 15.9 g/dL 1.1(B) 1.4(N)  Hematocrit 34.8 - 46.6 % 28.3(L) 28.1(L)  Platelets 145 - 400 10e3/uL 219 255   . CBC    Component Value Date/Time   WBC 12.3 (H) 07/22/2017 1302   WBC 7.7 05/05/2014 1233   RBC 4.56 07/22/2017 1302   RBC 4.72 05/05/2014 1233   HGB 8.5 (L) 07/22/2017 1302   HCT 28.3 (L) 07/22/2017 1302   PLT 219 07/22/2017 1302   MCV 62.1 (L) 07/22/2017 1302   MCH 18.6 (L) 07/22/2017 1302   MCH 18.6 (L) 05/05/2014 1233   MCHC 30.0 (L) 07/22/2017 1302   MCHC 31.3 05/05/2014 1233   RDW 17.1 (H) 07/22/2017 1302   LYMPHSABS 3.4 (H) 07/22/2017 1302   MONOABS 0.8 07/22/2017 1302   EOSABS 0.2 07/22/2017 1302   BASOSABS 0.0 07/22/2017 1302   . Lab Results  Component Value Date   RETICCTPCT 2.58 (H) 07/22/2017   RBC 4.56 07/22/2017   RETICCTABS 117.65 (H) 07/22/2017    . CMP Latest Ref Rng & Units 07/22/2017 05/05/2014  Glucose 70 - 140 mg/dl 95 829(F)  BUN 7.0 - 62.1 mg/dL 73.9(H) 39(H)  Creatinine 0.6 - 1.1 mg/dL 3.0(Q) 6.57(Q)  Sodium 136 - 145 mEq/L 143 142  Potassium 3.5 - 5.1 mEq/L 4.3 4.4  Chloride 96 - 112 mEq/L - 105  CO2 22 - 29 mEq/L 23 22  Calcium 8.4 - 10.4 mg/dL 46.9 9.8  Total Protein 6.4 - 8.3 g/dL 6.8 -  Total Bilirubin 0.20 - 1.20 mg/dL 6.29 -  Alkaline Phos 40 - 150 U/L 54 -  AST 5 - 34 U/L 25 -  ALT 0 - 55 U/L 47 -     Component     Latest Ref Rng & Units 07/22/2017  Iron     41 - 142 ug/dL 85  TIBC     528 - 413 ug/dL 244  UIBC     010 - 272 ug/dL 536  %SAT     21 - 57 % 20 (L)  Ferritin     9 - 269 ng/ml 224  Vitamin B12     232 - 1,245 pg/mL  359    RADIOGRAPHIC STUDIES: I have personally reviewed the radiological images as listed and agreed with the findings in the report. No results found.  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 No evidence of Iron deficiency based on labs Baseline hgb 8-10 Mild reticulocytosis from low level hemolysis . Anemia can worsening in the setting of infections, folic acid or Z61B12 deficiency. Could develop worsening of her anemia in the setting of CKD. Allopurinol can also have a bone marrow suppressive effect. Plan -lab workup and likely diagnosis were discussed with the patient. -no acute indication for PRBC transfusion at this time -would recommend aggressive correction of B12 deficiency -would be reasonable to take daily B complex 1 tab po BID to ensure adequate folic acid and other B vit replacement. -minimize Bone marrow suppressive medications -adjust allopurinol for renal function. -if patient gets more anemic despite this especially with her CKD might need to consider use of EPO. -no indication for BM Bx at this time. 2) . Patient Active Problem List   Diagnosis Date Noted  . 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 Aortic stenosis, moderate 07/31/2017  . Morbid obesity due to excess calories (HCC) 04/29/2017  . Obstructive sleep apnea 03/25/2017  . Rhinitis, nonallergic, chronic 03/25/2017   -continue f/u with PCP for mx of other medical co-morbidities.  Labs today RTC with Dr Candise CheKale in 2 months with labs  All of the patients questions were answered with apparent satisfaction. The patient knows to call the clinic with any problems, questions or concerns.  I spent 40 minutes counseling the patient face to face. The total time spent in the appointment was 60 minutes and more than 50% was on counseling and direct patient cares.    Wyvonnia LoraGautam Kale MD MS AAHIVMS [facility]  [facility] Hematology/Oncology Physician [facility]  (Office):       [phone] (Work cell):  [phone] (Fax):           [phone]  07/22/2017 11:56 AM

## 2017-07-23 LAB — VITAMIN B12: VITAMIN B 12: 359 pg/mL (ref 232–1245)

## 2017-07-24 LAB — HEMOGLOBINOPATHY EVALUATION
HGB C: 0 %
HGB S: 0 %
HGB VARIANT: 0 %
Hemoglobin A2 Quantitation: 4.5 % — ABNORMAL HIGH (ref 1.8–3.2)
Hemoglobin F Quantitation: 0 % (ref 0.0–2.0)
Hgb A: 95.5 % — ABNORMAL LOW (ref 96.4–98.8)

## 2017-07-29 ENCOUNTER — Encounter: Payer: Self-pay | Admitting: Internal Medicine

## 2017-07-30 ENCOUNTER — Encounter: Payer: Self-pay | Admitting: Internal Medicine

## 2017-07-30 ENCOUNTER — Ambulatory Visit (INDEPENDENT_AMBULATORY_CARE_PROVIDER_SITE_OTHER): Payer: Medicare HMO | Admitting: Internal Medicine

## 2017-07-30 VITALS — BP 120/74 | HR 70 | Ht 65.5 in | Wt 229.0 lb

## 2017-07-30 DIAGNOSIS — I350 Nonrheumatic aortic (valve) stenosis: Secondary | ICD-10-CM | POA: Diagnosis not present

## 2017-07-30 DIAGNOSIS — G4733 Obstructive sleep apnea (adult) (pediatric): Secondary | ICD-10-CM | POA: Diagnosis not present

## 2017-07-30 DIAGNOSIS — D563 Thalassemia minor: Secondary | ICD-10-CM

## 2017-07-30 NOTE — Patient Instructions (Signed)
Order- DME [facility]- We can continue CPAP auto 5-20, mask of choice, humidifier, supplies, AirView  Dx OSA  We can provide a phone number for the Bariatric program to help with weight loss  Please call as needed

## 2017-07-30 NOTE — Progress Notes (Signed)
HPI female former smoker followed for OSA, chronic rhinitis, complicated by aortic stenosis, thalassemia, B12 deficiency, HBP, peripheral neuropathy, gout, nasal septal perforation Unattended Home Sleep Test-04/15/17-AHI 41.8/hour, desaturation to 55% with mean saturation 87%, body weight 225 pounds ----------------------------------------------------------------------------------------------------------------  04/29/17- 71 year old female former smoker followed for OSA, chronic rhinitis, complicated by aortic stenosis, thalassemia, B12 deficiency, HBP, peripheral neuropathy, gout, nasal septal perforation Unattended Home Sleep Test-04/15/17-AHI 41.8/hour, desaturation to 55% with mean saturation 87%, body weight 225 pounds FOLLOWS FOR: Review HST with patient. She mentions history of mild seasonal asthma with occasional mild wheezing-never much. No history of lung disease that would explain low oxygen saturations during her sleep study. Obesity hypoventilation is most likely explanation. Incidental complaint of gout in her foot today. Occasional small clot from nose.  07/30/17- 71 year old female former smoker followed for OSA, chronic rhinitis, complicated by aortic stenosis, thalassemia, B12 deficiency, HBP, peripheral neuropathy, gout, nasal septal perforation CPAP auto 5-20/ FOLLOWS FOR: DME [facility]. Pt wears CPAP nighlty and DL attached. Pt is due for new supplies next week including a new chin strap. Download 90% compliance averaging over 7 hours, AHI 2.5/hour. Sleeps really well with CPAP. Back pain and other active medical problems interfere with sleep quality. She is getting back injected. Echocardiogram upgraded aortic stenosis from mild to moderate. These comorbidities contribute to her lack of energy during the daytime.  ROS-see HPI    "+" = pos Constitutional:    weight loss, night sweats, fevers, chills, +fatigue, lassitude. HEENT:    headaches, difficulty swallowing, tooth/dental problems,  sore throat,       sneezing, itching, ear ache, nasal congestion, post nasal drip, snoring CV:    chest pain, orthopnea, PND, swelling in lower extremities, anasarca,                                                         dizziness, palpitations Resp:   shortness of breath with exertion or at rest.                productive cough,   non-productive cough, coughing up of blood.              change in color of mucus. + wheezing.   Skin:    rash or lesions. GI:  No-   heartburn, indigestion, abdominal pain, nausea, vomiting, diarrhea,                 change in bowel habits, loss of appetite GU: dysuria, change in color of urine, no urgency or frequency.   flank pain. MS:   +joint pain, stiffness, decreased range of motion, back pain. Neuro-     nothing unusual Psych:  change in mood or affect.  depression or anxiety.   memory loss.  OBJ- Physical Exam General- Alert, Oriented, Affect-appropriate, Distress- none acute, + obese Skin- rash-none, lesions- none, excoriation- none Lymphadenopathy- none Head- atraumatic            Eyes- Gross vision intact, PERRLA, conjunctivae and secretions clear            Ears- Hearing, canals-normal            Nose- + obstructing granulation/polypoid material and clot L, + septal perforation, mucus, erosion,             Throat- Mallampati IV ,  mucosa clear , drainage- none, tonsils- atrophic Neck- flexible , trachea midline, no stridor , thyroid nl, carotid no bruit Chest - symmetrical excursion , unlabored           Heart/CV- RRR ,  Murmur +2/6 AS LUSB , no gallop  , no rub, nl s1 s2                           - JVD- none , edema- none, stasis changes- none, varices- none           Lung- clear to P&A, wheeze- none, cough- none , dullness-none, rub- none           Chest Marcotte-  Abd-  Br/ Gen/ Rectal- Not done, not indicated Extrem- cyanosis- none, clubbing, none, atrophy- none, strength- nl Neuro- grossly intact to observation

## 2017-07-31 DIAGNOSIS — I350 Nonrheumatic aortic (valve) stenosis: Secondary | ICD-10-CM | POA: Insufficient documentation

## 2017-07-31 DIAGNOSIS — D563 Thalassemia minor: Secondary | ICD-10-CM | POA: Insufficient documentation

## 2017-07-31 NOTE — Assessment & Plan Note (Signed)
Followed by cardiology. She says severity of rated for mild to moderate latest echocardiogram. She understands this may contribute to her lack of energy.

## 2017-07-31 NOTE — Assessment & Plan Note (Signed)
Her obesity contributes to her sleep apnea problems and compounds her other medical issues. Plan-bariatric referral

## 2017-07-31 NOTE — Assessment & Plan Note (Signed)
She has known thalassemia with hemoglobins generally running 8-9, managed by hematology

## 2017-07-31 NOTE — Assessment & Plan Note (Signed)
Good compliance and control with AutoPap 5-20. She is comfortable with pressure is and says she is sleeping very well. We will continue current settings.

## 2017-09-16 NOTE — Progress Notes (Signed)
Marland Kitchen    HEMATOLOGY/ONCOLOGY CONSULTATION NOTE  Date of Service: 09/21/2017  Patient Care Team: Charlies Silvers, PA-C as PCP - General (Physician Assistant)  CHIEF COMPLAINTS/PURPOSE OF CONSULTATION:  Microcytic Anemia  HISTORY OF PRESENTING ILLNESS:   Cristina Ali is a wonderful 71 y.o. female who has been referred to [place] by Dr .Charlies Silvers, PA-C  for evaluation and management of microcytic Anemia.  Patient has a h/o HTN,HLD, possible thalassemia, gout, B12 deficiency, CKD who on recent labs with her PCP was in mid June 2018 was noted to have anemia with a hgb of 8.2 and MCV of 60. She notes that she has had chronic anemia and her baseline hgb typically tends to be 8-10.  She reports that her mother had beta-thalassemia and sister and her son likely have thalassemia as well. She notes that she did need PRBC transfusion during pregnancy but has not been transfusion dependent otherwise.  She notes has mild fatigue. No CP/overt SOB or DOE. No dizziness or lightheadedness.  She is not replacement of B12 as per her PCP.  No fevers/chills/night sweats or unexpected weight loss  INTERVAL HISTORY:   Pt presents to the office today accompanied by her sister. She reports that they are doing well overall, besides occasional fatigue. She states that she has to intermittently rest throughout the day to alleviate her fatigue. Pt reports that she is taking the B12 injections and that she bought B-complex vitamins, but they do not taste well and makes her nauseous, so she hasn't taken it. Pt denies taking additional folic acids at this time. She notes that her kidney specialist recommended that there is no need for further follow up and to have her kidney levels checked by her PCP. Pt reports that she has only had one major gout flare since her diagnosis and notes that she may have had 3 "small" flares. Pt notes that if she eats an excessive amount of shrimp it adds to mild flares of her gout.  She reports that she takes 100 mg of allopurinol TID (once in the morning and twice at night). She states that her PCP is Charlies Silvers, PA-C at [facility] at [facility]. She states that she has a PMHx of aortic stenosis that has progressed from mild to moderate within the past year. She reports that she should have an appointment with her cardiologist this week.   On review of systems, pt reports fatigue. She denies recent infections.   MEDICAL HISTORY:  Past Medical History:  Diagnosis Date  . Anemia   . Back pain   . Diverticulosis   . Hypercholesteremia   . Hypertension   . Peripheral neuropathy   . Renal disorder   . Thalassemia   . Vitamin B 12 deficiency     SURGICAL HISTORY: Past Surgical History:  Procedure Laterality Date  . CHOLECYSTECTOMY    . VAGINAL HYSTERECTOMY N/A     SOCIAL HISTORY: Social History   Social History  . Marital status: Married    Spouse name: N/A  . Number of children: N/A  . Years of education: N/A   Occupational History  . Not on file.   Social History Main Topics  . Smoking status: Former Smoker    Packs/day: 0.50    Quit date: 12/22/1990  . Smokeless tobacco: Never Used  . Alcohol use No  . Drug use: No  . Sexual activity: Not on file   Other Topics Concern  . Not on file  Social History Narrative  . No narrative on file    FAMILY HISTORY: No family history on file.  ALLERGIES:  has No Known Allergies.  MEDICATIONS:  Current Outpatient Prescriptions  Medication Sig Dispense Refill  . allopurinol (ZYLOPRIM) 100 MG tablet Take 300 mg by mouth daily.     Marland Kitchen amLODipine (NORVASC) 10 MG tablet Take 10 mg by mouth daily.    Marland Kitchen aspirin 81 MG chewable tablet Chew by mouth daily.    . carvedilol (COREG) 25 MG tablet 1/2 tablet every AM and 1 tablet every PM    . cyanocobalamin (,VITAMIN B-12,) 1000 MCG/ML injection Inject 1 mL (1,000 mcg total) into the skin every 30 (thirty) days. 1 mL 11  . fenofibrate  160 MG tablet Take 160 mg by mouth daily.    . furosemide (LASIX) 40 MG tablet Take 40 mg by mouth.    . gabapentin (NEURONTIN) 300 MG capsule Take 300-600 mg by mouth 2 (two) times daily. Take 1 capsule (300 mg) in the morning and take 2 capsules (600 mg) at bedtime)    . irbesartan (AVAPRO) 300 MG tablet Take 300 mg by mouth daily.    . montelukast (SINGULAIR) 10 MG tablet Take 10 mg by mouth daily.     No current facility-administered medications for this visit.     REVIEW OF SYSTEMS:    10 Point review of Systems was done is negative except as noted above.  PHYSICAL EXAMINATION:  ECOG PERFORMANCE STATUS: 1 - Symptomatic but completely ambulatory  . Vitals:   09/21/17 1219  BP: (!) 122/55  Pulse: 76  Resp: 20  Temp: 97.9 F (36.6 C)  SpO2: 99%   Filed Weights   09/21/17 1219  Weight: 230 lb 6.4 oz (104.5 kg)   .Body mass index is 37.76 kg/m. HEENT - MMM, no scleral icterus, no JVD CVS- S1 S2 reg, 2/6 SM aortic area Lung - CTA b/l PA- soft , BS + no G/R/T Ext- no pedal edema    LABORATORY DATA:  I have reviewed the data as listed  . CBC Latest Ref Rng & Units 09/21/2017 07/22/2017 05/05/2014  WBC 3.9 - 10.3 10e3/uL 7.3 12.3(H) 7.7  Hemoglobin 11.6 - 15.9 g/dL 8.1(L) 8.5(L) 8.8(L)  Hematocrit 34.8 - 46.6 % 26.6(L) 28.3(L) 28.1(L)  Platelets 145 - 400 10e3/uL 223 219 255   . CBC    Component Value Date/Time   WBC 7.3 09/21/2017 1157   WBC 7.7 05/05/2014 1233   RBC 4.28 09/21/2017 1157   RBC 4.72 05/05/2014 1233   HGB 8.1 (L) 09/21/2017 1157   HCT 26.6 (L) 09/21/2017 1157   PLT 223 09/21/2017 1157   MCV 62.1 (L) 09/21/2017 1157   MCH 18.9 (L) 09/21/2017 1157   MCH 18.6 (L) 05/05/2014 1233   MCHC 30.5 (L) 09/21/2017 1157   MCHC 31.3 05/05/2014 1233   RDW 17.0 (H) 09/21/2017 1157   LYMPHSABS 2.7 09/21/2017 1157   MONOABS 0.5 09/21/2017 1157   EOSABS 0.3 09/21/2017 1157   BASOSABS 0.0 09/21/2017 1157   . Lab Results  Component Value Date   RETICCTPCT  2.47 (H) 09/21/2017   RBC 4.28 09/21/2017   RETICCTABS 105.72 (H) 09/21/2017    . CMP Latest Ref Rng & Units 07/22/2017 05/05/2014  Glucose 70 - 140 mg/dl 95 161(W)  BUN 7.0 - 96.0 mg/dL 73.9(H) 39(H)  Creatinine 0.6 - 1.1 mg/dL 4.5(W) 0.98(J)  Sodium 136 - 145 mEq/L 143 142  Potassium 3.5 - 5.1 mEq/L 4.3 4.4  Chloride 96 - 112 mEq/L - 105  CO2 22 - 29 mEq/L 23 22  Calcium 8.4 - 10.4 mg/dL 16.1 9.8  Total Protein 6.4 - 8.3 g/dL 6.8 -  Total Bilirubin 0.20 - 1.20 mg/dL 0.96 -  Alkaline Phos 40 - 150 U/L 54 -  AST 5 - 34 U/L 25 -  ALT 0 - 55 U/L 47 -     Component     Latest Ref Rng & Units 07/22/2017  Iron     41 - 142 ug/dL 85  TIBC     045 - 409 ug/dL 811  UIBC     914 - 782 ug/dL 956  %SAT     21 - 57 % 20 (L)  Ferritin     9 - 269 ng/ml 224  Vitamin B12     232 - 1,245 pg/mL 359    RADIOGRAPHIC STUDIES: I have personally reviewed the radiological images as listed and agreed with the findings in the report. No results found.  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 No evidence of Iron deficiency based on labs Baseline hgb 8-10 Mild reticulocytosis from low level hemolysis . Anemia can worsening in the setting of infections, folic acid or O13 deficiency. Could develop worsening of her anemia in the setting of CKD. Allopurinol can also have a bone marrow suppressive effect. -Patient is taking B12 injections and will began taking liquid B-complex vitamins as the tablet form makes her nauseous.  Plan -would be reasonable to begin taking 1-2 mg of Folic Acid along with B-complex vitamins.  -continue receiving B12 injections.  -Recommend Allopurinol to be adjusted by PCP to decrease suppressive effect on bone marrow. Lowest effective dose. -No indication for EPO injections at this time.  -labs completed today -RTC with Dr. Candise Che PRN  2)  . Patient Active Problem List   Diagnosis Date Noted  . 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 Aortic stenosis, moderate 07/31/2017  . Morbid obesity due to excess calories (HCC) 04/29/2017  . Obstructive sleep apnea 03/25/2017  . Rhinitis, nonallergic, chronic 03/25/2017   -continue f/u with PCP for mx of other medical co-morbidities.  Labs today RTC with Dr. Candise Che PRN - patient notes that she is moving to the [facility] and will setup medical f/u there.  All of the patients questions were answered with apparent satisfaction. The patient knows to call the clinic with any problems, questions or concerns.  I spent 20 minutes counseling the patient face to face. The total time spent in the appointment was 25 minutes and more than 50% was on counseling and direct patient cares.    Wyvonnia Lora MD MS AAHIVMS [facility] Hematology/Oncology Physician [facility]  (Office):       [phone] (Work cell):  [phone] (Fax):           [phone]  09/21/2017 12:20 PM   This document serves as a record of services personally performed by Wyvonnia Lora, MD. It was created on her behalf by Chestine Spore, a trained medical scribe. The creation of this record is based on the scribe's personal observations and the provider's statements to them. This document has been checked and approved by the attending provider.

## 2017-09-21 ENCOUNTER — Ambulatory Visit (HOSPITAL_BASED_OUTPATIENT_CLINIC_OR_DEPARTMENT_OTHER): Payer: Medicare HMO | Admitting: Hematology

## 2017-09-21 ENCOUNTER — Other Ambulatory Visit (HOSPITAL_BASED_OUTPATIENT_CLINIC_OR_DEPARTMENT_OTHER): Payer: Medicare HMO

## 2017-09-21 ENCOUNTER — Encounter: Payer: Self-pay | Admitting: Hematology

## 2017-09-21 VITALS — BP 122/55 | HR 76 | Temp 97.9°F | Resp 20 | Ht 65.5 in | Wt 230.4 lb

## 2017-09-21 DIAGNOSIS — D509 Iron deficiency anemia, unspecified: Secondary | ICD-10-CM

## 2017-09-21 DIAGNOSIS — D751 Secondary polycythemia: Secondary | ICD-10-CM

## 2017-09-21 DIAGNOSIS — E538 Deficiency of other specified B group vitamins: Secondary | ICD-10-CM

## 2017-09-21 LAB — CBC & DIFF AND RETIC
BASO%: 0.3 % (ref 0.0–2.0)
Basophils Absolute: 0 10*3/uL (ref 0.0–0.1)
EOS%: 4.6 % (ref 0.0–7.0)
Eosinophils Absolute: 0.3 10*3/uL (ref 0.0–0.5)
HCT: 26.6 % — ABNORMAL LOW (ref 34.8–46.6)
HGB: 8.1 g/dL — ABNORMAL LOW (ref 11.6–15.9)
Immature Retic Fract: 10.7 % — ABNORMAL HIGH (ref 1.60–10.00)
LYMPH%: 36.9 % (ref 14.0–49.7)
MCH: 18.9 pg — AB (ref 25.1–34.0)
MCHC: 30.5 g/dL — AB (ref 31.5–36.0)
MCV: 62.1 fL — ABNORMAL LOW (ref 79.5–101.0)
MONO#: 0.5 10*3/uL (ref 0.1–0.9)
MONO%: 6.1 % (ref 0.0–14.0)
NEUT%: 52.1 % (ref 38.4–76.8)
NEUTROS ABS: 3.8 10*3/uL (ref 1.5–6.5)
Platelets: 223 10*3/uL (ref 145–400)
RBC: 4.28 10*6/uL (ref 3.70–5.45)
RDW: 17 % — AB (ref 11.2–14.5)
RETIC %: 2.47 % — AB (ref 0.70–2.10)
Retic Ct Abs: 105.72 10*3/uL — ABNORMAL HIGH (ref 33.70–90.70)
WBC: 7.3 10*3/uL (ref 3.9–10.3)
lymph#: 2.7 10*3/uL (ref 0.9–3.3)

## 2017-09-21 LAB — COMPREHENSIVE METABOLIC PANEL
ALT: 23 U/L (ref 0–55)
AST: 24 U/L (ref 5–34)
Albumin: 3.9 g/dL (ref 3.5–5.0)
Alkaline Phosphatase: 63 U/L (ref 40–150)
Anion Gap: 11 mEq/L (ref 3–11)
BUN: 49.4 mg/dL — AB (ref 7.0–26.0)
CHLORIDE: 107 meq/L (ref 98–109)
CO2: 22 mEq/L (ref 22–29)
Calcium: 9.5 mg/dL (ref 8.4–10.4)
Creatinine: 1.7 mg/dL — ABNORMAL HIGH (ref 0.6–1.1)
EGFR: 30 mL/min/{1.73_m2} — ABNORMAL LOW (ref >90)
GLUCOSE: 128 mg/dL (ref 70–140)
POTASSIUM: 4.7 meq/L (ref 3.5–5.1)
SODIUM: 140 meq/L (ref 136–145)
Total Bilirubin: 0.41 mg/dL (ref 0.20–1.20)
Total Protein: 6.9 g/dL (ref 6.4–8.3)

## 2017-09-21 NOTE — Patient Instructions (Signed)
Thank you for choosing [facility] to provide your oncology and hematology care.  To afford each patient quality time with our providers, please arrive 30 minutes before your scheduled appointment time.  If you arrive late for your appointment, you may be asked to reschedule.  We strive to give you quality time with our providers, and arriving late affects you and other patients whose appointments are after yours.   If you are a no show for multiple scheduled visits, you may be dismissed from the clinic at the providers discretion.    Again, thank you for choosing [facility], our hope is that these requests will decrease the amount of time that you wait before being seen by our physicians.  ______________________________________________________________________  Should you have questions after your visit to the [facility], please contact our office at [phone] between the hours of 8:30 and 4:30 p.m.    Voicemails left after 4:30p.m will not be returned until the following business day.    For prescription refill requests, please have your pharmacy contact us directly.  Please also try to allow 48 hours for prescription requests.    Please contact the scheduling department for questions regarding scheduling.  For scheduling of procedures such as PET scans, CT scans, MRI, Ultrasound, etc please contact central scheduling at [phone].    Resources For Cancer Patients and Caregivers:   Oncolink.org:  A wonderful resource for patients and healthcare providers for information regarding your disease, ways to tract your treatment, what to expect, etc.     American Cancer Society:  800-227-2345  Can help patients locate various types of support and financial assistance  Cancer Care: 1-800-813-HOPE (4673) Provides financial assistance, online support groups, medication/co-pay assistance.    [place] DSS:  336-641-3447 Where to apply for food  stamps, Medicaid, and utility assistance  Medicare Rights Center: 800-333-4114 Helps people with Medicare understand their rights and benefits, navigate the Medicare system, and secure the quality healthcare they deserve  SCAT: 336-333-6589 [employer]'s shared-ride transportation service for eligible riders who have a disability that prevents them from riding the fixed route bus.    For additional information on assistance programs please contact our social worker:   Grier Hock/Abigail Elmore:  [phone]            

## 2017-09-22 LAB — VITAMIN B12: VITAMIN B 12: 638 pg/mL (ref 232–1245)

## 2017-09-22 LAB — ANTI-PARIETAL ANTIBODY: Parietal Cell Ab: 12.5 Units (ref 0.0–20.0)

## 2017-09-22 LAB — FOLATE RBC
FOLATE, HEMOLYSATE: 307.8 ng/mL
Folate, RBC: 1184 ng/mL (ref >498)
HEMATOCRIT: 26 % — AB (ref 34.0–46.6)

## 2017-09-22 LAB — INTRINSIC FACTOR ANTIBODIES: INTRINSIC FACTOR ABS, SERUM: 1 [AU]/ml (ref 0.0–1.1)

## 2017-09-22 LAB — HEPATITIS C ANTIBODY: Hep C Virus Ab: <0.1 s/co ratio (ref 0.0–0.9)

## 2017-09-29 ENCOUNTER — Encounter (HOSPITAL_COMMUNITY): Payer: Self-pay

## 2017-09-29 ENCOUNTER — Inpatient Hospital Stay (HOSPITAL_COMMUNITY)
Admission: EM | Admit: 2017-09-29 | Discharge: 2017-10-02 | DRG: 683 | Disposition: A | Discharge status: Home / Self Care | Payer: Medicare HMO | Attending: Cardiology | Admitting: Cardiology

## 2017-09-29 ENCOUNTER — Emergency Department (HOSPITAL_COMMUNITY): Payer: Medicare HMO

## 2017-09-29 DIAGNOSIS — Z87891 Personal history of nicotine dependence: Secondary | ICD-10-CM

## 2017-09-29 DIAGNOSIS — I350 Nonrheumatic aortic (valve) stenosis: Secondary | ICD-10-CM | POA: Diagnosis present

## 2017-09-29 DIAGNOSIS — Z23 Encounter for immunization: Secondary | ICD-10-CM

## 2017-09-29 DIAGNOSIS — R001 Bradycardia, unspecified: Secondary | ICD-10-CM | POA: Diagnosis present

## 2017-09-29 DIAGNOSIS — E871 Hypo-osmolality and hyponatremia: Secondary | ICD-10-CM | POA: Diagnosis present

## 2017-09-29 DIAGNOSIS — E872 Acidosis: Secondary | ICD-10-CM | POA: Diagnosis present

## 2017-09-29 DIAGNOSIS — E669 Obesity, unspecified: Secondary | ICD-10-CM | POA: Diagnosis present

## 2017-09-29 DIAGNOSIS — R0989 Other specified symptoms and signs involving the circulatory and respiratory systems: Secondary | ICD-10-CM | POA: Diagnosis present

## 2017-09-29 DIAGNOSIS — I459 Conduction disorder, unspecified: Secondary | ICD-10-CM | POA: Diagnosis present

## 2017-09-29 DIAGNOSIS — R55 Syncope and collapse: Secondary | ICD-10-CM | POA: Diagnosis present

## 2017-09-29 DIAGNOSIS — N179 Acute kidney failure, unspecified: Secondary | ICD-10-CM | POA: Diagnosis not present

## 2017-09-29 DIAGNOSIS — Z6837 Body mass index (BMI) 37.0-37.9, adult: Secondary | ICD-10-CM

## 2017-09-29 DIAGNOSIS — E782 Mixed hyperlipidemia: Secondary | ICD-10-CM | POA: Diagnosis present

## 2017-09-29 DIAGNOSIS — E538 Deficiency of other specified B group vitamins: Secondary | ICD-10-CM | POA: Diagnosis present

## 2017-09-29 DIAGNOSIS — D563 Thalassemia minor: Secondary | ICD-10-CM | POA: Diagnosis present

## 2017-09-29 DIAGNOSIS — Z7982 Long term (current) use of aspirin: Secondary | ICD-10-CM

## 2017-09-29 DIAGNOSIS — G629 Polyneuropathy, unspecified: Secondary | ICD-10-CM | POA: Diagnosis present

## 2017-09-29 DIAGNOSIS — Z79899 Other long term (current) drug therapy: Secondary | ICD-10-CM

## 2017-09-29 DIAGNOSIS — N184 Chronic kidney disease, stage 4 (severe): Secondary | ICD-10-CM | POA: Diagnosis present

## 2017-09-29 DIAGNOSIS — D509 Iron deficiency anemia, unspecified: Secondary | ICD-10-CM | POA: Diagnosis present

## 2017-09-29 DIAGNOSIS — I498 Other specified cardiac arrhythmias: Secondary | ICD-10-CM

## 2017-09-29 DIAGNOSIS — I959 Hypotension, unspecified: Secondary | ICD-10-CM | POA: Diagnosis present

## 2017-09-29 DIAGNOSIS — E875 Hyperkalemia: Secondary | ICD-10-CM | POA: Diagnosis present

## 2017-09-29 DIAGNOSIS — E860 Dehydration: Secondary | ICD-10-CM | POA: Diagnosis present

## 2017-09-29 DIAGNOSIS — I129 Hypertensive chronic kidney disease with stage 1 through stage 4 chronic kidney disease, or unspecified chronic kidney disease: Secondary | ICD-10-CM | POA: Diagnosis present

## 2017-09-29 LAB — COMPREHENSIVE METABOLIC PANEL
ALBUMIN: 3.9 g/dL (ref 3.5–5.0)
ALT: 26 U/L (ref 14–54)
ANION GAP: 11 (ref 5–15)
AST: 35 U/L (ref 15–41)
Alkaline Phosphatase: 45 U/L (ref 38–126)
BILIRUBIN TOTAL: 0.5 mg/dL (ref 0.3–1.2)
BUN: 70 mg/dL — ABNORMAL HIGH (ref 6–20)
CO2: 20 mmol/L — ABNORMAL LOW (ref 22–32)
Calcium: 8.9 mg/dL (ref 8.9–10.3)
Chloride: 102 mmol/L (ref 101–111)
Creatinine, Ser: 3 mg/dL — ABNORMAL HIGH (ref 0.44–1.00)
GFR calc Af Amer: 17 mL/min — ABNORMAL LOW (ref >60)
GFR, EST NON AFRICAN AMERICAN: 15 mL/min — AB (ref >60)
GLUCOSE: 131 mg/dL — AB (ref 65–99)
POTASSIUM: 6 mmol/L — AB (ref 3.5–5.1)
Sodium: 133 mmol/L — ABNORMAL LOW (ref 135–145)
TOTAL PROTEIN: 6.5 g/dL (ref 6.5–8.1)

## 2017-09-29 LAB — CBC WITH DIFFERENTIAL/PLATELET
BASOS ABS: 0.1 10*3/uL (ref 0.0–0.1)
Basophils Relative: 1 %
Eosinophils Absolute: 0.3 10*3/uL (ref 0.0–0.7)
Eosinophils Relative: 3 %
HEMATOCRIT: 26.1 % — AB (ref 36.0–46.0)
Hemoglobin: 8 g/dL — ABNORMAL LOW (ref 12.0–15.0)
LYMPHS ABS: 3.5 10*3/uL (ref 0.7–4.0)
Lymphocytes Relative: 33 %
MCH: 18.7 pg — ABNORMAL LOW (ref 26.0–34.0)
MCHC: 30.7 g/dL (ref 30.0–36.0)
MCV: 61 fL — ABNORMAL LOW (ref 78.0–100.0)
MONOS PCT: 5 %
Monocytes Absolute: 0.5 10*3/uL (ref 0.1–1.0)
NEUTROS ABS: 6.1 10*3/uL (ref 1.7–7.7)
Neutrophils Relative %: 58 %
Platelets: 226 10*3/uL (ref 150–400)
RBC: 4.28 MIL/uL (ref 3.87–5.11)
RDW: 16.7 % — AB (ref 11.5–15.5)
WBC: 10.5 10*3/uL (ref 4.0–10.5)

## 2017-09-29 LAB — I-STAT TROPONIN, ED: TROPONIN I, POC: 0 ng/mL (ref 0.00–0.08)

## 2017-09-29 MED ORDER — ASPIRIN 81 MG PO CHEW
81.0000 mg | CHEWABLE_TABLET | Freq: Every day | ORAL | Status: DC
  Administered 2017-09-30 – 2017-10-02 (×3): 81 mg via ORAL
  Filled 2017-09-29 (×3): qty 1

## 2017-09-29 MED ORDER — FENOFIBRATE 160 MG PO TABS
160.0000 mg | ORAL_TABLET | Freq: Every day | ORAL | Status: DC
Start: 2017-09-30 — End: 2017-10-02
  Administered 2017-09-30 – 2017-10-02 (×3): 160 mg via ORAL
  Filled 2017-09-29 (×3): qty 1

## 2017-09-29 MED ORDER — ALLOPURINOL 300 MG PO TABS: 300.0000 mg | ORAL_TABLET | Freq: Every day | ORAL | Status: DC

## 2017-09-29 MED ORDER — B COMPLEX-C PO TABS
1.0000 | ORAL_TABLET | Freq: Every day | ORAL | Status: DC
  Administered 2017-09-30 – 2017-10-02 (×3): 1 via ORAL
  Filled 2017-09-29 (×3): qty 1

## 2017-09-29 MED ORDER — SODIUM POLYSTYRENE SULFONATE 15 GM/60ML PO SUSP
30.0000 g | Freq: Once | ORAL | Status: AC
  Administered 2017-09-30: 30 g via ORAL
  Filled 2017-09-29: qty 120

## 2017-09-29 MED ORDER — AMLODIPINE BESYLATE 10 MG PO TABS: 10.0000 mg | ORAL_TABLET | Freq: Every day | ORAL | Status: DC

## 2017-09-29 MED ORDER — CYANOCOBALAMIN 1000 MCG/ML IJ SOLN: 1000.0000 ug | INTRAMUSCULAR | Status: DC

## 2017-09-29 MED ORDER — GABAPENTIN 300 MG PO CAPS
600.0000 mg | ORAL_CAPSULE | Freq: Every day | ORAL | Status: DC
  Administered 2017-09-30 – 2017-10-01 (×3): 600 mg via ORAL
  Filled 2017-09-29 (×4): qty 2

## 2017-09-29 MED ORDER — MONTELUKAST SODIUM 10 MG PO TABS
10.0000 mg | ORAL_TABLET | Freq: Every day | ORAL | Status: DC
  Administered 2017-09-30 – 2017-10-02 (×3): 10 mg via ORAL
  Filled 2017-09-29 (×3): qty 1

## 2017-09-29 MED ORDER — GABAPENTIN 300 MG PO CAPS
300.0000 mg | ORAL_CAPSULE | Freq: Every day | ORAL | Status: DC
  Administered 2017-09-30 – 2017-10-02 (×3): 300 mg via ORAL
  Filled 2017-09-29 (×3): qty 1

## 2017-09-29 MED ORDER — FUROSEMIDE 40 MG PO TABS: 40.0000 mg | ORAL_TABLET | Freq: Every day | ORAL | Status: DC

## 2017-09-29 MED ORDER — OMEGA-3-ACID ETHYL ESTERS 1 G PO CAPS
1.0000 g | ORAL_CAPSULE | Freq: Every day | ORAL | Status: DC
  Administered 2017-09-30 – 2017-10-02 (×3): 1 g via ORAL
  Filled 2017-09-29 (×3): qty 1

## 2017-09-29 NOTE — ED Notes (Signed)
Called to bedside, pt reports increased nausea and lightheadedness.

## 2017-09-29 NOTE — ED Provider Notes (Signed)
[facility]-EMERGENCY DEPT Provider Note   CSN: [id number] Arrival date & time: 09/29/17  2013     History   Chief Complaint No chief complaint on file.   HPI Cristina Ali is a 71 y.o. female history of hypertension, high cholesterol, here presenting with near syncope. Patient states that she was at church this evening and was doing some chores and felt lightheaded and dizzy and almost passed out. She sat down so didn't hit her head. Denies any chest pain prior to this episode. EMS was called and she was noted to be in bigeminy with no history of heart blocks. She is on multiple antihypertensives.   The history is provided by the patient.    Past Medical History:  Diagnosis Date  . Anemia   . Back pain   . Diverticulosis   . Hypercholesteremia   . Hypertension   . Peripheral neuropathy   . Renal disorder   . Thalassemia   . Vitamin B 12 deficiency     Patient Active Problem List   Diagnosis Date Noted  . 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 Aortic stenosis, moderate 07/31/2017  . Morbid obesity due to excess calories (HCC) 04/29/2017  . Obstructive sleep apnea 03/25/2017  . Rhinitis, nonallergic, chronic 03/25/2017    Past Surgical History:  Procedure Laterality Date  . CHOLECYSTECTOMY    . VAGINAL HYSTERECTOMY N/A     OB History    No data available       Home Medications    Prior to Admission medications   Medication Sig Start Date End Date Taking? Authorizing Provider  allopurinol (ZYLOPRIM) 100 MG tablet Take 300 mg by mouth daily.    Yes [provider]  amLODipine (NORVASC) 10 MG tablet Take 10 mg by mouth daily.   Yes [provider]  aspirin 81 MG chewable tablet Chew by mouth daily.   Yes [provider]  b complex vitamins tablet Take 1 tablet by mouth daily.   Yes [provider]  carvedilol (COREG) 25 MG tablet 1/2 tablet every AM and 1 tablet every PM 03/16/17  Yes [provider]  colchicine (COLCRYS) 0.6 MG  tablet Take 0.6 mg by mouth as needed.   Yes [provider]  cyanocobalamin (,VITAMIN B-12,) 1000 MCG/ML injection Inject 1 mL (1,000 mcg total) into the skin every 30 (thirty) days. 07/22/17  Yes Johney Maine, MD  fenofibrate 160 MG tablet Take 160 mg by mouth daily.   Yes [provider]  furosemide (LASIX) 40 MG tablet Take 40 mg by mouth.   Yes [provider]  gabapentin (NEURONTIN) 300 MG capsule Take 300-600 mg by mouth 2 (two) times daily. Take 1 capsule (300 mg) in the morning and take 2 capsules (600 mg) at bedtime)   Yes [provider]  irbesartan (AVAPRO) 300 MG tablet Take 300 mg by mouth daily.   Yes [provider]  montelukast (SINGULAIR) 10 MG tablet Take 10 mg by mouth daily.   Yes [provider]  omega-3 acid ethyl esters (LOVAZA) 1 g capsule Take 1 g by mouth daily. 09/24/17  Yes [provider]    Family History History reviewed. No pertinent family history.  Social History Social History  Substance Use Topics  . Smoking status: Former Smoker    Packs/day: 0.50    Quit date: 12/22/1990  . Smokeless tobacco: Never Used  . Alcohol use No     Allergies   Patient has no known allergies.  Review of Systems Review of Systems  Neurological: Positive for dizziness.  All other systems reviewed and are negative.    Physical Exam Updated Vital Signs BP (!) 107/46   Pulse 71   Temp 98.2 F (36.8 C) (Oral)   Resp 17   SpO2 98%   Physical Exam  Constitutional: She is oriented to person, place, and time. She appears well-developed.  HENT:  Head: Normocephalic.  Mouth/Throat: Oropharynx is clear and moist.  Eyes: Pupils are equal, round, and reactive to light. Conjunctivae and EOM are normal.  Neck: Normal range of motion. Neck supple.  Cardiovascular: Normal rate and regular rhythm.   + 2/6 systolic mumur  Pulmonary/Chest: Effort normal and breath sounds normal. No respiratory distress.  She has no wheezes. She has no rales.  Abdominal: Soft. Bowel sounds are normal. She exhibits no distension. There is no tenderness.  Musculoskeletal: Normal range of motion.  Neurological: She is alert and oriented to person, place, and time. No cranial nerve deficit. Coordination normal.  Skin: Skin is warm.  Psychiatric: She has a normal mood and affect.  Nursing note and vitals reviewed.    ED Treatments / Results  Labs (all labs ordered are listed, but only abnormal results are displayed) Labs Reviewed  CBC WITH DIFFERENTIAL/PLATELET - Abnormal; Notable for the following:       Result Value   Hemoglobin 8.0 (*)    HCT 26.1 (*)    MCV 61.0 (*)    MCH 18.7 (*)    RDW 16.7 (*)    All other components within normal limits  COMPREHENSIVE METABOLIC PANEL  I-STAT TROPONIN, ED    EKG  EKG Interpretation  Date/Time:  Tuesday September 29 2017 20:54:59 EDT Ventricular Rate:  95 PR Interval:    QRS Duration: 108 QT Interval:  383 QTC Calculation: 339 R Axis:   67 Text Interpretation:  Accelerated junctional rhythm Ventricular bigeminy Borderline low voltage, extremity leads Short QT interval Junctional rhythm new since previous  Confirmed by Richardean Canal [id number]) on 09/29/2017 8:59:17 PM       Radiology Dg Chest 2 View  Result Date: 09/29/2017 CLINICAL DATA:  Near syncope. EXAM: CHEST  2 VIEW COMPARISON:  02/21/2013 FINDINGS: The heart size is mildly enlarged. There is no pleural effusion or edema. No airspace opacities identified. The visualized osseous structures are unremarkable. IMPRESSION: 1. No acute findings. 2. Cardiac enlargement. Electronically Signed   By: Signa Kell M.D.   On: 09/29/2017 21:42    Procedures Procedures (including critical care time)  Medications Ordered in ED Medications - No data to display   Initial Impression / Assessment and Plan / ED Course  I have reviewed the triage vital signs and the nursing notes.  Pertinent labs & imaging  results that were available during my care of the patient were reviewed by me and considered in my medical decision making (see chart for details).     Cristina Ali is a 72 y.o. female here with near syncope. Patient has junctional rhythm with bigeminy that was new. Her BP is low 100s, mentating well. I called Dr. Nadara Eaton, her private cardiologist. He will take her off carvedilol and amlodipine and will observe her on tele overnight.   Final Clinical Impressions(s) / ED Diagnoses   Final diagnoses:  None    New Prescriptions New Prescriptions   No medications on file     Charlynne Pander, MD 09/29/17 2146

## 2017-09-29 NOTE — ED Notes (Signed)
Attempted report 

## 2017-09-29 NOTE — ED Notes (Signed)
Pt's sister went with pt recently to a cardiology appt in which the cardiologist recommended pt have a carotid ultrasound.

## 2017-09-29 NOTE — ED Triage Notes (Signed)
Pt presents with onset of lightheadedness followed by nausea and vomiting.  EMS reports pt had episode of hypotension with SBP in 80s.

## 2017-09-30 ENCOUNTER — Observation Stay (HOSPITAL_COMMUNITY): Payer: Medicare HMO

## 2017-09-30 DIAGNOSIS — D563 Thalassemia minor: Secondary | ICD-10-CM | POA: Diagnosis present

## 2017-09-30 DIAGNOSIS — D509 Iron deficiency anemia, unspecified: Secondary | ICD-10-CM | POA: Diagnosis present

## 2017-09-30 DIAGNOSIS — G629 Polyneuropathy, unspecified: Secondary | ICD-10-CM | POA: Diagnosis present

## 2017-09-30 DIAGNOSIS — Z87891 Personal history of nicotine dependence: Secondary | ICD-10-CM | POA: Diagnosis not present

## 2017-09-30 DIAGNOSIS — E875 Hyperkalemia: Secondary | ICD-10-CM | POA: Diagnosis present

## 2017-09-30 DIAGNOSIS — R55 Syncope and collapse: Secondary | ICD-10-CM | POA: Diagnosis present

## 2017-09-30 DIAGNOSIS — E538 Deficiency of other specified B group vitamins: Secondary | ICD-10-CM | POA: Diagnosis present

## 2017-09-30 DIAGNOSIS — I129 Hypertensive chronic kidney disease with stage 1 through stage 4 chronic kidney disease, or unspecified chronic kidney disease: Secondary | ICD-10-CM | POA: Diagnosis present

## 2017-09-30 DIAGNOSIS — I959 Hypotension, unspecified: Secondary | ICD-10-CM | POA: Diagnosis present

## 2017-09-30 DIAGNOSIS — I350 Nonrheumatic aortic (valve) stenosis: Secondary | ICD-10-CM | POA: Diagnosis present

## 2017-09-30 DIAGNOSIS — E782 Mixed hyperlipidemia: Secondary | ICD-10-CM | POA: Diagnosis present

## 2017-09-30 DIAGNOSIS — Z23 Encounter for immunization: Secondary | ICD-10-CM | POA: Diagnosis present

## 2017-09-30 DIAGNOSIS — E860 Dehydration: Secondary | ICD-10-CM | POA: Diagnosis present

## 2017-09-30 DIAGNOSIS — Z6837 Body mass index (BMI) 37.0-37.9, adult: Secondary | ICD-10-CM | POA: Diagnosis not present

## 2017-09-30 DIAGNOSIS — E871 Hypo-osmolality and hyponatremia: Secondary | ICD-10-CM | POA: Diagnosis present

## 2017-09-30 DIAGNOSIS — R0989 Other specified symptoms and signs involving the circulatory and respiratory systems: Secondary | ICD-10-CM | POA: Diagnosis present

## 2017-09-30 DIAGNOSIS — Z79899 Other long term (current) drug therapy: Secondary | ICD-10-CM | POA: Diagnosis not present

## 2017-09-30 DIAGNOSIS — Z7982 Long term (current) use of aspirin: Secondary | ICD-10-CM | POA: Diagnosis not present

## 2017-09-30 DIAGNOSIS — E669 Obesity, unspecified: Secondary | ICD-10-CM | POA: Diagnosis present

## 2017-09-30 DIAGNOSIS — E872 Acidosis: Secondary | ICD-10-CM | POA: Diagnosis present

## 2017-09-30 DIAGNOSIS — N184 Chronic kidney disease, stage 4 (severe): Secondary | ICD-10-CM | POA: Diagnosis present

## 2017-09-30 DIAGNOSIS — R001 Bradycardia, unspecified: Secondary | ICD-10-CM | POA: Diagnosis present

## 2017-09-30 DIAGNOSIS — N179 Acute kidney failure, unspecified: Secondary | ICD-10-CM | POA: Diagnosis present

## 2017-09-30 DIAGNOSIS — I459 Conduction disorder, unspecified: Secondary | ICD-10-CM | POA: Diagnosis present

## 2017-09-30 LAB — BASIC METABOLIC PANEL
Anion gap: 14 (ref 5–15)
BUN: 73 mg/dL — ABNORMAL HIGH (ref 6–20)
CALCIUM: 9.3 mg/dL (ref 8.9–10.3)
CO2: 18 mmol/L — ABNORMAL LOW (ref 22–32)
CREATININE: 4.13 mg/dL — AB (ref 0.44–1.00)
Chloride: 103 mmol/L (ref 101–111)
GFR calc non Af Amer: 10 mL/min — ABNORMAL LOW (ref >60)
GFR, EST AFRICAN AMERICAN: 12 mL/min — AB (ref >60)
Glucose, Bld: 146 mg/dL — ABNORMAL HIGH (ref 65–99)
POTASSIUM: 6.1 mmol/L — AB (ref 3.5–5.1)
SODIUM: 135 mmol/L (ref 135–145)

## 2017-09-30 LAB — RENAL FUNCTION PANEL
ALBUMIN: 3.8 g/dL (ref 3.5–5.0)
Anion gap: 13 (ref 5–15)
BUN: 74 mg/dL — AB (ref 6–20)
CALCIUM: 9.1 mg/dL (ref 8.9–10.3)
CO2: 21 mmol/L — ABNORMAL LOW (ref 22–32)
CREATININE: 4.95 mg/dL — AB (ref 0.44–1.00)
Chloride: 102 mmol/L (ref 101–111)
GFR calc non Af Amer: 8 mL/min — ABNORMAL LOW (ref >60)
GFR, EST AFRICAN AMERICAN: 9 mL/min — AB (ref >60)
Glucose, Bld: 132 mg/dL — ABNORMAL HIGH (ref 65–99)
PHOSPHORUS: 5.3 mg/dL — AB (ref 2.5–4.6)
Potassium: 5.2 mmol/L — ABNORMAL HIGH (ref 3.5–5.1)
Sodium: 136 mmol/L (ref 135–145)

## 2017-09-30 LAB — CBC
HCT: 26.9 % — ABNORMAL LOW (ref 36.0–46.0)
Hemoglobin: 8.4 g/dL — ABNORMAL LOW (ref 12.0–15.0)
MCH: 18.8 pg — ABNORMAL LOW (ref 26.0–34.0)
MCHC: 31.2 g/dL (ref 30.0–36.0)
MCV: 60.3 fL — ABNORMAL LOW (ref 78.0–100.0)
PLATELETS: 234 10*3/uL (ref 150–400)
RBC: 4.46 MIL/uL (ref 3.87–5.11)
RDW: 16.9 % — AB (ref 11.5–15.5)
WBC: 15.3 10*3/uL — AB (ref 4.0–10.5)

## 2017-09-30 LAB — TSH: TSH: 2.766 u[IU]/mL (ref 0.350–4.500)

## 2017-09-30 MED ORDER — SODIUM POLYSTYRENE SULFONATE 15 GM/60ML PO SUSP
30.0000 g | Freq: Two times a day (BID) | ORAL | Status: AC
  Administered 2017-09-30: 30 g via ORAL
  Filled 2017-09-30: qty 120

## 2017-09-30 MED ORDER — FUROSEMIDE 10 MG/ML IJ SOLN
40.0000 mg | Freq: Once | INTRAMUSCULAR | Status: AC
  Administered 2017-09-30: 40 mg via INTRAVENOUS
  Filled 2017-09-30: qty 4

## 2017-09-30 MED ORDER — INFLUENZA VAC SPLIT HIGH-DOSE 0.5 ML IM SUSY
0.5000 mL | PREFILLED_SYRINGE | INTRAMUSCULAR | Status: AC
  Administered 2017-10-01: 0.5 mL via INTRAMUSCULAR
  Filled 2017-09-30: qty 0.5

## 2017-09-30 MED ORDER — SODIUM CHLORIDE 0.9 % IV SOLN
INTRAVENOUS | Status: DC
  Administered 2017-09-30: 03:00:00 via INTRAVENOUS

## 2017-09-30 MED ORDER — SODIUM CHLORIDE 0.9 % IV SOLN
INTRAVENOUS | Status: AC
  Administered 2017-09-30: 09:00:00 via INTRAVENOUS

## 2017-09-30 MED ORDER — CALCIUM GLUCONATE 10 % IV SOLN
1.0000 g | Freq: Once | INTRAVENOUS | Status: AC
  Administered 2017-09-30: 1 g via INTRAVENOUS
  Filled 2017-09-30: qty 10

## 2017-09-30 MED ORDER — STERILE WATER FOR INJECTION IV SOLN
150.0000 meq | INTRAVENOUS | Status: DC
  Administered 2017-09-30 – 2017-10-01 (×2): 150 meq via INTRAVENOUS
  Filled 2017-09-30 (×4): qty 850

## 2017-09-30 MED ORDER — ONDANSETRON HCL 4 MG/2ML IJ SOLN
4.0000 mg | Freq: Once | INTRAMUSCULAR | Status: AC
  Administered 2017-09-30: 4 mg via INTRAVENOUS
  Filled 2017-09-30: qty 2

## 2017-09-30 MED ORDER — CALCIUM GLUCONATE 10 % IV SOLN: 1.0000 g | Freq: Once | INTRAVENOUS | Status: DC

## 2017-09-30 NOTE — Consult Note (Signed)
Referring Provider: No ref. 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 She has had fatigue and weakness all year, Potassium was elevated at 6.0 with creatinine elevated at 3.4 admission now increased to 4.1. She has been anuric since last night. She had a renal ultrasound although there was no bladder distention She appears dehydrated and pale on exam  Past Medical History:  Diagnosis Date  . Anemia   . Back pain   . Diverticulosis   . Hypercholesteremia   . Hypertension   . Peripheral neuropathy   . Renal disorder   . Thalassemia   . Vitamin B 12 deficiency     Past Surgical History:  Procedure Laterality Date  . CHOLECYSTECTOMY    . VAGINAL HYSTERECTOMY N/A     Prior to Admission medications   Medication Sig Start Date End Date Taking? Authorizing Provider  allopurinol (ZYLOPRIM) 100 MG tablet Take 300 mg by mouth daily.    Yes [provider]  amLODipine (NORVASC) 10 MG tablet Take 10 mg by mouth daily.   Yes [provider]  aspirin 81 MG chewable tablet Chew by mouth daily.   Yes [provider]  b complex vitamins tablet Take 1 tablet by mouth daily.   Yes [provider]  carvedilol (COREG) 25 MG tablet 1/2 tablet every AM and 1 tablet every PM 03/16/17  Yes [provider]  colchicine (COLCRYS) 0.6 MG tablet Take 0.6 mg by mouth as needed.   Yes [provider]  cyanocobalamin (,VITAMIN B-12,) 1000 MCG/ML injection Inject 1 mL (1,000 mcg total) into the skin every 30 (thirty) days. 07/22/17  Yes Johney Maine, MD  fenofibrate 160 MG tablet Take 160 mg by mouth daily.   Yes [provider]  furosemide (LASIX) 40 MG tablet Take 40 mg by mouth.   Yes [provider]  gabapentin (NEURONTIN) 300 MG capsule Take 300-600 mg by mouth 2 (two) times daily. Take 1 capsule (300 mg) in the morning and take 2 capsules (600 mg) at bedtime)   Yes [provider]  irbesartan (AVAPRO) 300 MG tablet Take 300 mg by mouth daily.   Yes [provider]  montelukast (SINGULAIR) 10 MG tablet Take 10 mg by mouth daily.   Yes [provider]  omega-3 acid ethyl esters (LOVAZA) 1 g capsule Take 1 g by mouth daily. 09/24/17  Yes [provider]    Current Facility-Administered Medications  Medication Dose Route Frequency Provider Last Rate Last Dose  . 0.9 %  sodium chloride infusion   Intravenous Continuous Patwardhan, Manish J, MD 100 mL/hr at 09/30/17 0900    . aspirin chewable tablet 81 mg  81 mg Oral Daily Yates Decamp, MD   81 mg at 09/30/17 4540  . B-complex with vitamin C tablet 1 tablet  1 tablet Oral Daily Yates Decamp, MD   1 tablet at 09/30/17 0920  . [  START ON 10/24/2017] cyanocobalamin ((VITAMIN B-12)) injection 1,000 mcg  1,000 mcg Subcutaneous Q30 days Yates Decamp, MD      . fenofibrate tablet 160 mg  160 mg Oral Daily Yates Decamp, MD   160 mg at 09/30/17 0921  . gabapentin (NEURONTIN) capsule 300 mg  300 mg Oral Daily Yates Decamp, MD   300 mg at 09/30/17 7829  . gabapentin (NEURONTIN) capsule 600 mg  600 mg Oral QHS Yates Decamp, MD   600 mg at 09/30/17 0020  . montelukast (SINGULAIR) tablet 10 mg  10 mg Oral Daily Yates Decamp, MD   10 mg at 09/30/17 5621  . omega-3 acid ethyl esters (LOVAZA) capsule 1 g  1 g Oral Daily Yates Decamp, MD   1 g at 09/30/17 0921  . sodium bicarbonate 150 mEq in sterile water  1,000 mL infusion  150 mEq Intravenous Continuous Elvis Coil, MD 75 mL/hr at 09/30/17 1033 150 mEq at 09/30/17 1033    Allergies as of 09/29/2017  . (No Known Allergies)    History reviewed. No pertinent family history.  Social History   Social History  . Marital status: Married    Spouse name: N/A  . Number of children: N/A  . Years of education: N/A   Occupational History  . Not on file.   Social History Main Topics  . Smoking status: Former Smoker    Packs/day: 0.50    Quit date: 12/22/1990  . Smokeless tobacco: Never Used  . Alcohol use No  . Drug use: No  . Sexual activity: Not on file   Other Topics Concern  . Not on file   Social History Narrative  . No narrative on file    Review of Systems: Gen: Denies any fever, chills, sweats, anorexia,+  fatigue, +weakness, + malaise, weight loss, and sleep disorder HEENT: No visual complaints, No history of Retinopathy. Normal external appearance No Epistaxis or Sore throat. No sinusitis.   CV: Denies chest pain, angina, palpitations, + presyncope, orthopnea, PND, peripheral edema, and claudication. Resp: Denies dyspnea at rest, dyspnea with exercise, cough, sputum, wheezing, coughing up blood, and pleurisy. GI: Denies vomiting blood, jaundice, and fecal incontinence.   Denies dysphagia or odynophagia. GU : Denies urinary burning, blood in urine, urinary frequency, urinary hesitancy, nocturnal urination, and urinary incontinence.  No renal calculi. MS: Denies joint pain, limitation of movement, and swelling, stiffness, low back pain, extremity pain. Denies muscle weakness, cramps, atrophy.  No use of non steroidal antiinflammatory drugs. Derm: Denies rash, itching, dry skin, hives, moles, warts, or unhealing ulcers.  Psych: Denies depression, anxiety, memory loss, suicidal ideation, hallucinations, paranoia, and confusion. Heme: Denies bruising, bleeding, and enlarged lymph nodes. Neuro: No headache.  No diplopia. No  dysarthria.  No dysphasia.  No history of CVA.  No Seizures. No paresthesias.  No weakness. Endocrine No DM.  No Thyroid disease.  No Adrenal disease.  Physical Exam: Vital signs in last 24 hours: Temp:  [98.2 F (36.8 C)-98.6 F (37 C)] 98.6 F (37 C) (10/10 0145) Pulse Rate:  [42-93] 44 (10/10 0400) Resp:  [17-31] 25 (10/10 0400) BP: (88-119)/(40-51) 111/49 (10/10 0400) SpO2:  [93 %-100 %] 93 % (10/10 0400) Weight:  [224 lb (101.6 kg)] 224 lb (101.6 kg) (10/10 0100) Last BM Date: 09/30/17 General:   Alert,  Well-developed, well-nourished, pleasant and cooperative in NAD Head:  Normocephalic and atraumatic. Eyes:  Sclera clear, no icterus.   Conjunctiva pink. Ears:  Normal auditory acuity. Nose:  No deformity,  discharge,  or lesions. Mouth:  No deformity or lesions, dentition normal. Neck:  Supple; no masses or thyromegaly. JVP not elevated Lungs:  Clear throughout to auscultation.   No wheezes, crackles, or rhonchi. No acute distress. Heart:  Regular rate and rhythm; no murmurs, clicks, rubs,  or gallops. Abdomen:  Soft, nontender and nondistended. No masses, hepatosplenomegaly or hernias noted. Normal bowel sounds, without guarding, and without rebound.   Msk:  Symmetrical without gross deformities. Normal posture. Pulses:  No carotid, renal, femoral bruits. DP and PT symmetrical and equal Extremities:  Without clubbing or edema. Neurologic:  Alert and  oriented x4;  grossly normal neurologically. Skin:  Intact without significant lesions or rashes. Cervical Nodes:  No significant cervical adenopathy. Psych:  Alert and cooperative. Normal mood and affect.  Intake/Output from previous day: No intake/output data recorded. Intake/Output this shift: No intake/output data recorded.  Lab Results:  Recent Labs  09/29/17 2033 09/30/17 0542  WBC 10.5 15.3*  HGB 8.0* 8.4*  HCT 26.1* 26.9*  PLT 226 234   BMET  Recent Labs  09/29/17 2033 09/30/17 0542  NA 133* 135  K  6.0* 6.1*  CL 102 103  CO2 20* 18*  GLUCOSE 131* 146*  BUN 70* 73*  CREATININE 3.00* 4.13*  CALCIUM 8.9 9.3   LFT  Recent Labs  09/29/17 2033  PROT 6.5  ALBUMIN 3.9  AST 35  ALT 26  ALKPHOS 45  BILITOT 0.5   PT/INR No results for input(s): LABPROT, INR in the last 72 hours. Hepatitis Panel No results for input(s): HEPBSAG, HCVAB, HEPAIGM, HEPBIGM in the last 72 hours.  Studies/Results: Dg Chest 2 View  Result Date: 09/29/2017 CLINICAL DATA:  Near syncope. EXAM: CHEST  2 VIEW COMPARISON:  02/21/2013 FINDINGS: The heart size is mildly enlarged. There is no pleural effusion or edema. No airspace opacities identified. The visualized osseous structures are unremarkable. IMPRESSION: 1. No acute findings. 2. Cardiac enlargement. Electronically Signed   By: Signa Kell M.D.   On: 09/29/2017 21:42    Assessment/Plan:  Acute renal failure that appears to be related to presyncope event and some symptomatic hypotension  Her BP are low and she was taking an ARB  There was no use of NSAIDS  Hyperkalemia  I think that this is due to her volume depleted state and ARB and would not restart this medication  Dehydration will start IV fluids   Metabolic acidosis start IV bicarbonate  Anemia stable  I would continue IV fluids and follow up on K later today    LOS: 0 [place] W  :40 AM

## 2017-09-30 NOTE — H&P (Addendum)
Cristina Ali is an 71 y.o. female.   Chief Complaint: Near syncope  HPI:   71 year old Caucasian female with known CK stage III baseline creatinine around 1.7. Mild to moderate aortic stenosis, normal and systolic function., Hypertension, hyperlipidemia, thalassemia minor with baseline hemoglobin between 8-9, presented to the ED on 09/29/2017 with episode of near syncopal E. Patient was at a church function when she was standing up and working when she felt lightheaded. She did not lose consciousness or have any injury. She denies any palpitations, chest pain, shortness of breath. In the ED, workup was remarkable for junctional rhythm with bigeminy and peak T waves. Potassium was elevated at 6.0 with creatinine elevated at 3.4 admission now increased to 4.1. She has been anuric since last night. She denies any recent change in her medications. She has been trying to lose weight and has decreased her oral intake in the last few days. She denies any significant change in her fluid intake. From cardiac standpoint, she was recently seen by Dr. Lesleigh Noe the clinic and was ordered to have vascular ultrasound for her bilateral carotid bruits which still pending. She denies any focal neurological weakness on the time of her near syncope. Overnight, she was given Kayexalate, calcium gluconate, and 40 mg Lasix to reverse her hyperkalemia without any significant improvement. She remains him a dynamically and electrically stable.  Agencies Dr. Shelia Media for nephrology who is recently retired. She also sees Dr. Jarold Song for her thalassemia minor.  Past Medical History:  Diagnosis Date  . Anemia   . Back pain   . Diverticulosis   . Hypercholesteremia   . Hypertension   . Peripheral neuropathy   . Renal disorder   . Thalassemia   . Vitamin B 12 deficiency     Past Surgical History:  Procedure Laterality Date  . CHOLECYSTECTOMY    . VAGINAL HYSTERECTOMY N/A     History reviewed. No pertinent family  history. Social History:  reports that she quit smoking about 26 years ago. She smoked 0.50 packs per day. She has never used smokeless tobacco. She reports that she does not drink alcohol or use drugs.  Allergies: No Known Allergies  Medications Prior to Admission  Medication Sig Dispense Refill  . allopurinol (ZYLOPRIM) 100 MG tablet Take 300 mg by mouth daily.     Marland Kitchen amLODipine (NORVASC) 10 MG tablet Take 10 mg by mouth daily.    Marland Kitchen aspirin 81 MG chewable tablet Chew by mouth daily.    Marland Kitchen b complex vitamins tablet Take 1 tablet by mouth daily.    . carvedilol (COREG) 25 MG tablet 1/2 tablet every AM and 1 tablet every PM    . colchicine (COLCRYS) 0.6 MG tablet Take 0.6 mg by mouth as needed.    . cyanocobalamin (,VITAMIN B-12,) 1000 MCG/ML injection Inject 1 mL (1,000 mcg total) into the skin every 30 (thirty) days. 1 mL 11  . fenofibrate 160 MG tablet Take 160 mg by mouth daily.    . furosemide (LASIX) 40 MG tablet Take 40 mg by mouth.    . gabapentin (NEURONTIN) 300 MG capsule Take 300-600 mg by mouth 2 (two) times daily. Take 1 capsule (300 mg) in the morning and take 2 capsules (600 mg) at bedtime)    . irbesartan (AVAPRO) 300 MG tablet Take 300 mg by mouth daily.    . montelukast (SINGULAIR) 10 MG tablet Take 10 mg by mouth daily.    Marland Kitchen omega-3 acid ethyl esters (LOVAZA) 1 g  capsule Take 1 g by mouth daily.      Results for orders placed or performed during the hospital encounter of 09/29/17 (from the past 48 hour(s))  CBC with Differential/Platelet     Status: Abnormal   Collection Time: 09/29/17  8:33 PM  Result Value Ref Range   WBC 10.5 4.0 - 10.5 K/uL   RBC 4.28 3.87 - 5.11 MIL/uL   Hemoglobin 8.0 (L) 12.0 - 15.0 g/dL   HCT 26.1 (L) 36.0 - 46.0 %   MCV 61.0 (L) 78.0 - 100.0 fL   MCH 18.7 (L) 26.0 - 34.0 pg   MCHC 30.7 30.0 - 36.0 g/dL   RDW 16.7 (H) 11.5 - 15.5 %   Platelets 226 150 - 400 K/uL   Neutrophils Relative % 58 %   Lymphocytes Relative 33 %   Monocytes Relative  5 %   Eosinophils Relative 3 %   Basophils Relative 1 %   Neutro Abs 6.1 1.7 - 7.7 K/uL   Lymphs Abs 3.5 0.7 - 4.0 K/uL   Monocytes Absolute 0.5 0.1 - 1.0 K/uL   Eosinophils Absolute 0.3 0.0 - 0.7 K/uL   Basophils Absolute 0.1 0.0 - 0.1 K/uL   RBC Morphology TARGET CELLS     Comment: TEARDROP CELLS BASOPHILIC STIPPLING ELLIPTOCYTES   Comprehensive metabolic panel     Status: Abnormal   Collection Time: 09/29/17  8:33 PM  Result Value Ref Range   Sodium 133 (L) 135 - 145 mmol/L   Potassium 6.0 (H) 3.5 - 5.1 mmol/L   Chloride 102 101 - 111 mmol/L   CO2 20 (L) 22 - 32 mmol/L   Glucose, Bld 131 (H) 65 - 99 mg/dL   BUN 70 (H) 6 - 20 mg/dL   Creatinine, Ser 3.00 (H) 0.44 - 1.00 mg/dL   Calcium 8.9 8.9 - 10.3 mg/dL   Total Protein 6.5 6.5 - 8.1 g/dL   Albumin 3.9 3.5 - 5.0 g/dL   AST 35 15 - 41 U/L   ALT 26 14 - 54 U/L   Alkaline Phosphatase 45 38 - 126 U/L   Total Bilirubin 0.5 0.3 - 1.2 mg/dL   GFR calc non Af Amer 15 (L) >60 mL/min   GFR calc Af Amer 17 (L) >60 mL/min    Comment: (NOTE) The eGFR has been calculated using the CKD EPI equation. This calculation has not been validated in all clinical situations. eGFR's persistently <60 mL/min signify possible Chronic Kidney Disease.    Anion gap 11 5 - 15  I-stat troponin, ED     Status: None   Collection Time: 09/29/17  8:48 PM  Result Value Ref Range   Troponin i, poc 0.00 0.00 - 0.08 ng/mL   Comment 3            Comment: Due to the release kinetics of cTnI, a negative result within the first hours of the onset of symptoms does not rule out myocardial infarction with certainty. If myocardial infarction is still suspected, repeat the test at appropriate intervals.   Basic metabolic panel     Status: Abnormal   Collection Time: 09/30/17  5:42 AM  Result Value Ref Range   Sodium 135 135 - 145 mmol/L   Potassium 6.1 (H) 3.5 - 5.1 mmol/L   Chloride 103 101 - 111 mmol/L   CO2 18 (L) 22 - 32 mmol/L   Glucose, Bld 146 (H)  65 - 99 mg/dL   BUN 73 (H) 6 - 20 mg/dL  Creatinine, Ser 4.13 (H) 0.44 - 1.00 mg/dL   Calcium 9.3 8.9 - 10.3 mg/dL   GFR calc non Af Amer 10 (L) >60 mL/min   GFR calc Af Amer 12 (L) >60 mL/min    Comment: (NOTE) The eGFR has been calculated using the CKD EPI equation. This calculation has not been validated in all clinical situations. eGFR's persistently <60 mL/min signify possible Chronic Kidney Disease.    Anion gap 14 5 - 15  TSH     Status: None   Collection Time: 09/30/17  5:42 AM  Result Value Ref Range   TSH 2.766 0.350 - 4.500 uIU/mL    Comment: Performed by a 3rd Generation assay with a functional sensitivity of <=0.01 uIU/mL.  CBC     Status: Abnormal   Collection Time: 09/30/17  5:42 AM  Result Value Ref Range   WBC 15.3 (H) 4.0 - 10.5 K/uL   RBC 4.46 3.87 - 5.11 MIL/uL   Hemoglobin 8.4 (L) 12.0 - 15.0 g/dL   HCT 26.9 (L) 36.0 - 46.0 %   MCV 60.3 (L) 78.0 - 100.0 fL   MCH 18.8 (L) 26.0 - 34.0 pg   MCHC 31.2 30.0 - 36.0 g/dL   RDW 16.9 (H) 11.5 - 15.5 %   Platelets 234 150 - 400 K/uL   Dg Chest 2 View  Result Date: 09/29/2017 CLINICAL DATA:  Near syncope. EXAM: CHEST  2 VIEW COMPARISON:  02/21/2013 FINDINGS: The heart size is mildly enlarged. There is no pleural effusion or edema. No airspace opacities identified. The visualized osseous structures are unremarkable. IMPRESSION: 1. No acute findings. 2. Cardiac enlargement. Electronically Signed   By: Kerby Moors M.D.   On: 09/29/2017 21:42    Review of Systems  Constitutional: Positive for malaise/fatigue.  HENT: Negative.   Eyes: Negative for blurred vision and double vision.  Respiratory: Negative for cough and shortness of breath.   Cardiovascular: Negative for chest pain, palpitations, orthopnea, claudication, leg swelling and PND.  Gastrointestinal: Positive for nausea and vomiting.  Genitourinary:       Anuria   Musculoskeletal: Negative.   Skin: Negative.   Neurological: Negative for sensory change,  focal weakness and loss of consciousness.       Pre-syncope  Psychiatric/Behavioral: The patient is nervous/anxious.   All other systems reviewed and are negative.   Blood pressure (!) 111/49, pulse (!) 44, temperature 98.6 F (37 C), temperature source Oral, resp. rate (!) 25, height '5\' 5"'$  (1.651 m), weight 101.6 kg (224 lb), SpO2 93 %. Physical Exam  Nursing note and vitals reviewed. Constitutional: She is oriented to person, place, and time. She appears well-developed.  Mildly obese   HENT:  Head: Normocephalic.  Neck: No JVD present.  Cardiovascular: Regular rhythm.   Murmur (II/VI systolic murmur radiating to carotids) heard. Bradycardia  GI: Soft. Bowel sounds are normal. She exhibits distension (Mild distension without any profound tenderness).  Musculoskeletal: She exhibits no edema.  Neurological: She is alert and oriented to person, place, and time.  Skin: Skin is warm and dry.     Assessment: Hyperkalemia Acute kidney injury, baseline CKD stage III Mild aortic stenosis ( Mean PG 21 mmhg, AVA 1.3 cm2), preserved LVEF Bilateral carotid bruit, possibly conducted aortic murmur Thalassemia minor Obesity   Plan: Presyncope episode likely vasovagal or related to hyperkalemia in the setting of acute kidney injury. Her aortic stenosis is mild and carotid bruits are likely conducted aortic murmurs. In absence of any focal neurological weakness, I  do not think her carotid stenosis, even if present, caused her episode. She has hyperkalemia, but no pulmonary edema . Hold all antihypertensive medications. Gentle hydration with 100 mL an hour normal saline. Kayexalate to reverse hyperkalemia. I have personally spoken with Dr. Justin Mend, nephrologist from [facility] who graciously agreed to see the patient. Ultrasound performed, result pending. Dr. Justin Mend recommended placing a Foley catheter. We'll follow his recommendations.  Nigel Mormon, MD 09/30/2017, 8:28 AM  Nigel Mormon, MD [facility] Pager: [phone] Office: [phone] If no answer Cell [phone]

## 2017-10-01 LAB — BASIC METABOLIC PANEL
Anion gap: 13 (ref 5–15)
BUN: 67 mg/dL — AB (ref 6–20)
CHLORIDE: 97 mmol/L — AB (ref 101–111)
CO2: 27 mmol/L (ref 22–32)
CREATININE: 4.03 mg/dL — AB (ref 0.44–1.00)
Calcium: 9 mg/dL (ref 8.9–10.3)
GFR calc Af Amer: 12 mL/min — ABNORMAL LOW (ref >60)
GFR calc non Af Amer: 10 mL/min — ABNORMAL LOW (ref >60)
GLUCOSE: 119 mg/dL — AB (ref 65–99)
POTASSIUM: 4 mmol/L (ref 3.5–5.1)
SODIUM: 137 mmol/L (ref 135–145)

## 2017-10-01 LAB — RETICULOCYTES
RBC.: 4.1 MIL/uL (ref 3.87–5.11)
RETIC CT PCT: 2.6 % (ref 0.4–3.1)
Retic Count, Absolute: 106.6 10*3/uL (ref 19.0–186.0)

## 2017-10-01 LAB — IRON AND TIBC
Iron: 56 ug/dL (ref 28–170)
SATURATION RATIOS: 13 % (ref 10.4–31.8)
TIBC: 434 ug/dL (ref 250–450)
UIBC: 378 ug/dL

## 2017-10-01 LAB — FOLATE: Folate: 16.7 ng/mL (ref >5.9)

## 2017-10-01 LAB — FERRITIN: Ferritin: 398 ng/mL — ABNORMAL HIGH (ref 11–307)

## 2017-10-01 LAB — VITAMIN B12: VITAMIN B 12: 1188 pg/mL — AB (ref 180–914)

## 2017-10-01 MED ORDER — GUAIFENESIN 100 MG/5ML PO SOLN
15.0000 mL | Freq: Four times a day (QID) | ORAL | Status: DC | PRN
  Administered 2017-10-01: 300 mg via ORAL
  Filled 2017-10-01 (×2): qty 15

## 2017-10-01 NOTE — Progress Notes (Signed)
[facility] ROUNDING NOTE   Subjective:   Interval History:Acute on chronic renal disease  Hyperkalemia K >6  And oliguria  Acute renal failure that appears to be related to presyncope event and some symptomatic hypotension  Her BP are low and she was taking an ARB  There was no use of NSAIDS Appear very much improved this am  Will discontinue fluids and also foley catheter Objective:  Vital signs in last 24 hours:  Temp:  [97.2 F (36.2 C)-98.5 F (36.9 C)] 98.4 F (36.9 C) (10/11 0520) Pulse Rate:  [71-97] 71 (10/11 0520) Resp:  [12-20] 12 (10/11 0520) BP: (102-132)/(55-59) 126/59 (10/11 0520) SpO2:  [97 %-98 %] 97 % (10/11 0520)  Weight change:  Filed Weights   09/30/17 0100  Weight: 224 lb (101.6 kg)    Intake/Output: I/O last 3 completed shifts: In: 1308.8 [I.V.:1308.8] Out: 1750 [Urine:1750]   Intake/Output this shift:  Total I/O In: -  Out: 1000 [Urine:1000]  CVS- RRR RS- CTA ABD- BS present soft non-distended EXT- no edema   Basic Metabolic Panel:  Recent Labs Lab 09/29/17 2033 09/30/17 0542 09/30/17 1233 10/01/17 0750  NA 133* 135 136 137  K 6.0* 6.1* 5.2* 4.0  CL 102 103 102 97*  CO2 20* 18* 21* 27  GLUCOSE 131* 146* 132* 119*  BUN 70* 73* 74* 67*  CREATININE 3.00* 4.13* 4.95* 4.03*  CALCIUM 8.9 9.3 9.1 9.0  PHOS  --   --  5.3*  --     Liver Function Tests:  Recent Labs Lab 09/29/17 2033 09/30/17 1233  AST 35  --   ALT 26  --   ALKPHOS 45  --   BILITOT 0.5  --   PROT 6.5  --   ALBUMIN 3.9 3.8   No results for input(s): LIPASE, AMYLASE in the last 168 hours. No results for input(s): AMMONIA in the last 168 hours.  CBC:  Recent Labs Lab 09/29/17 2033 09/30/17 0542  WBC 10.5 15.3*  NEUTROABS 6.1  --   HGB 8.0* 8.4*  HCT 26.1* 26.9*  MCV 61.0* 60.3*  PLT 226 234    Cardiac Enzymes: No results for input(s): CKTOTAL, CKMB, CKMBINDEX, TROPONINI in the last 168 hours.  BNP: Invalid input(s):  POCBNP  CBG: No results for input(s): GLUCAP in the last 168 hours.  Microbiology: No results found for this or any previous visit.  Coagulation Studies: No results for input(s): LABPROT, INR in the last 72 hours.  Urinalysis: No results for input(s): COLORURINE, LABSPEC, PHURINE, GLUCOSEU, HGBUR, BILIRUBINUR, KETONESUR, PROTEINUR, UROBILINOGEN, NITRITE, LEUKOCYTESUR in the last 72 hours.  Invalid input(s): APPERANCEUR    Imaging: Dg Chest 2 View  Result Date: 09/29/2017 CLINICAL DATA:  Near syncope. EXAM: CHEST  2 VIEW COMPARISON:  02/21/2013 FINDINGS: The heart size is mildly enlarged. There is no pleural effusion or edema. No airspace opacities identified. The visualized osseous structures are unremarkable. IMPRESSION: 1. No acute findings. 2. Cardiac enlargement. Electronically Signed   By: Signa Kell M.D.   On: 09/29/2017 21:42   US Renal  Result Date: 09/30/2017 CLINICAL DATA:  Acute kidney injury EXAM: RENAL / URINARY TRACT ULTRASOUND COMPLETE COMPARISON:  None. FINDINGS: Right Kidney: Length: 11.8 cm. Renal cortical thinning. Echogenicity within normal limits. No mass or hydronephrosis visualized. Left Kidney: Length: 11.5 cm. Renal cortical thinning. Echogenicity within normal limits. No mass or hydronephrosis visualized. Bladder: Not well-visualized. Other: Increased hepatic echogenicity as can be seen with hepatic steatosis. IMPRESSION: 1. Bilateral renal cortical  thinning as can be seen with medical renal disease. 2. Increased hepatic echogenicity as can be seen with hepatic steatosis. Electronically Signed   By: Elige Ko   On: 09/30/2017 15:22     Medications:    . aspirin  81 mg Oral Daily  . B-complex with vitamin C  1 tablet Oral Daily  . [START ON 10/24/2017] cyanocobalamin  1,000 mcg Subcutaneous Q30 days  . fenofibrate  160 mg Oral Daily  . gabapentin  300 mg Oral Daily  . gabapentin  600 mg Oral QHS  . montelukast  10 mg Oral Daily  . omega-3 acid  ethyl esters  1 g Oral Daily     Assessment/ Plan:   Acute renal failure that appears to be related to presyncope event and some symptomatic hypotension  Her BP are low and she was taking an ARB  There was no use of NSAIDS. She is much better   Hyperkalemia  I think that this is due to her volume depleted state and ARB and would not restart this medication  Dehydration improved and will stop fluids  Metabolic acidosis resolved  Anemia stable  Volume appears better and blood pressure improved  I think it better to hold all antihypertensives at this time with a view to gradually reintroducing as her blood pressure rises  I think that the issue of hyperkalemia would make me nervous restarting ACE or ARB     LOS: 1 Shadrick Senne W  :40 AM

## 2017-10-01 NOTE — Care Management Note (Signed)
Case Management Note Donn Pierini RN, BSN Unit 4E-Case Manager [phone]   Patient Details  Name: Cristina Ali MRN: [id number] Date of Birth: [birth year]  Subjective/Objective:    Pt admitted with near syncope hyperkalemia- acute on chronic renal failure               Action/Plan: PTA pt lived at home with spouse- anticipate return home- CM to follow.   Expected Discharge Date:                 Expected Discharge Plan:  Home/Self Care  In-House Referral:     Discharge planning Services  CM Consult  Post Acute Care Choice:    Choice offered to:     DME Arranged:    DME Agency:     HH Arranged:    HH Agency:     Status of Service:  In process, will continue to follow  If discussed at Long Length of Stay Meetings, dates discussed:    Discharge Disposition:   Additional Comments:  Darrold Span, RN 10/01/2017, 3:49 PM

## 2017-10-01 NOTE — Progress Notes (Signed)
History: 71 year old Caucasian female with known CK stage III baseline creatinine around 1.7. Mild to moderate aortic stenosis, normal and systolic function., Hypertension, hyperlipidemia, thalassemia minor with baseline hemoglobin between 8-9, presented to the ED on 09/29/2017 with episode of near syncopal and was found to be in acute renal failure along with hyperkalemia and junctional escape rhythm.  Subjective:  Feels that her this morning. No shortness of breath.  Objective:  Vital Signs in the last 24 hours: Temp:  [97.2 F (36.2 C)-98.5 F (36.9 C)] 98.4 F (36.9 C) (10/11 0520) Pulse Rate:  [71-97] 71 (10/11 0520) Resp:  [12-20] 12 (10/11 0520) BP: (102-132)/(55-59) 126/59 (10/11 0520) SpO2:  [97 %-98 %] 97 % (10/11 0520)  Intake/Output from previous day: 10/10 0701 - 10/11 0700 In: 1308.8 [I.V.:1308.8] Out: 1750 [Urine:1750]  Physical Exam: Blood pressure (!) 126/59, pulse 71, temperature 98.4 F (36.9 C), temperature source Axillary, resp. rate 12, height  (1.651 m), weight 101.6 kg (224 lb), SpO2 97 %. Body mass index is 37.28 kg/m. General appearance: alert, cooperative, appears stated age, no distress and moderately obese Eyes: negative findings: lids and lashes normal Neck: no adenopathy, no JVD, supple, symmetrical, trachea midline, thyroid not enlarged, symmetric, no tenderness/mass/nodules and No carotid bruit Neck: JVP - normal, carotids 2+= without bruits Resp: clear to auscultation bilaterally Chest Breck: no tenderness Cardio: S1, S2 normal, no S3 or S4 and systolic murmur: early systolic 2/6, crescendo at 2nd right intercostal space GI: soft, non-tender; bowel sounds normal; no masses,  no organomegaly Extremities: extremities normal, atraumatic, no cyanosis or edema    Lab Results: BMP  Recent Labs  09/30/17 0542 09/30/17 1233 10/01/17 0750  NA 135 136 137  K 6.1* 5.2* 4.0  CL 103 102 97*  CO2 18* 21* 27  GLUCOSE 146* 132* 119*  BUN 73* 74*  67*  CREATININE 4.13* 4.95* 4.03*  CALCIUM 9.3 9.1 9.0  GFRNONAA 10* 8* 10*  GFRAA 12* 9* 12*    CBC  Recent Labs Lab 09/29/17 2033 09/30/17 0542  WBC 10.5 15.3*  RBC 4.28 4.46  HGB 8.0* 8.4*  HCT 26.1* 26.9*  PLT 226 234  MCV 61.0* 60.3*  MCH 18.7* 18.8*  MCHC 30.7 31.2  RDW 16.7* 16.9*  LYMPHSABS 3.5  --   MONOABS 0.5  --   EOSABS 0.3  --   BASOSABS 0.1  --    Recent Labs  09/30/17 0542  TSH 2.766    Recent Labs  07/22/17 1302 09/21/17 1157 09/29/17 2033 09/30/17 1233  PROT 6.8 6.9 6.5  --   ALBUMIN 3.8 3.9 3.9 3.8  AST 25 24 35  --   ALT 47 23 26  --   ALKPHOS 54 63 45  --   BILITOT 0.43 0.41 0.5  --     Imaging: Dg Chest 2 View  Result Date: 09/29/2017 CLINICAL DATA:  Near syncope. EXAM: CHEST  2 VIEW COMPARISON:  02/21/2013 FINDINGS: The heart size is mildly enlarged. There is no pleural effusion or edema. No airspace opacities identified. The visualized osseous structures are unremarkable. IMPRESSION: 1. No acute findings. 2. Cardiac enlargement. Electronically Signed   By: Signa Kell M.D.   On: 09/29/2017 21:42   US Renal  Result Date: 09/30/2017 CLINICAL DATA:  Acute kidney injury EXAM: RENAL / URINARY TRACT ULTRASOUND COMPLETE COMPARISON:  None. FINDINGS: Right Kidney: Length: 11.8 cm. Renal cortical thinning. Echogenicity within normal limits. No mass or hydronephrosis visualized. Left Kidney: Length: 11.5 cm. Renal cortical  thinning. Echogenicity within normal limits. No mass or hydronephrosis visualized. Bladder: Not well-visualized. Other: Increased hepatic echogenicity as can be seen with hepatic steatosis. IMPRESSION: 1. Bilateral renal cortical thinning as can be seen with medical renal disease. 2. Increased hepatic echogenicity as can be seen with hepatic steatosis. Electronically Signed   By: Elige Ko   On: 09/30/2017 15:22    Cardiac Studies:  EKG 09/30/2017: Junctional escape rhythm at the rate of 46 bpm. Chol T waves, consider  hyperkalemia.  Telemetry 10/01/2017: Normal sinus rhythm  Scheduled Meds: . aspirin  81 mg Oral Daily  . B-complex with vitamin C  1 tablet Oral Daily  . [START ON 10/24/2017] cyanocobalamin  1,000 mcg Subcutaneous Q30 days  . fenofibrate  160 mg Oral Daily  . gabapentin  300 mg Oral Daily  . gabapentin  600 mg Oral QHS  . montelukast  10 mg Oral Daily  . omega-3 acid ethyl esters  1 g Oral Daily   Continuous Infusions: PRN Meds:.   Assessment/Plan:  1. Acute renal failure stage IV secondary to uncontrolled hypertension in the past, acute failure due to probable dehydration leading to hyperkalemia and heart block with junctional escape rhythm, now resolved junctional escape, patient back in sinus rhythm. Patient after our last office visit, went on aggressive dieting and went on vegetarian diet, may have contributed to acute renal failure in view of fluid loss and continued ARB. Patient was hypotensive on admission. 2. Mild to moderate aortic stenosis by recent echocardiogram with normal LVEF. 3. Hypertension 4. Hyperglycemia 5. Severe anemia, microcytic indicis  Recommendation: Serum creatinine is trending down. We will avoid any antihypertensive medications for now. Check anemia panel. Will send for occult blood in stool. Probable discharge in the morning. Appreciate nephrology consultation. As stated above, patient is now on aggressive diet with weight loss protocol and also is avoiding salty food, and this may have acutely precipitated hypotension and renal failure. Do not suspect cardiac etiology to be primary.  Yates Decamp, M.D. 10/01/2017, 9:20 AM [facility] Pager: [phone] Office: [phone] If no answer: [phone]

## 2017-10-01 NOTE — Progress Notes (Signed)
RT called to bedside pt desatting on cpap. RT bled in 2L O2 through CPAP and pt sats 98%. Advised pt or RN to call if needed anything else. RT to cont to monitor as needed.

## 2017-10-02 LAB — BASIC METABOLIC PANEL
Anion gap: 10 (ref 5–15)
BUN: 54 mg/dL — AB (ref 6–20)
CALCIUM: 8.9 mg/dL (ref 8.9–10.3)
CHLORIDE: 104 mmol/L (ref 101–111)
CO2: 25 mmol/L (ref 22–32)
CREATININE: 2.99 mg/dL — AB (ref 0.44–1.00)
GFR calc non Af Amer: 15 mL/min — ABNORMAL LOW (ref >60)
GFR, EST AFRICAN AMERICAN: 17 mL/min — AB (ref >60)
Glucose, Bld: 108 mg/dL — ABNORMAL HIGH (ref 65–99)
Potassium: 3.9 mmol/L (ref 3.5–5.1)
Sodium: 139 mmol/L (ref 135–145)

## 2017-10-02 MED ORDER — AMLODIPINE BESYLATE 10 MG PO TABS: 5.0000 mg | ORAL_TABLET | Freq: Every day | ORAL | Status: AC

## 2017-10-02 NOTE — Progress Notes (Signed)
[facility] ROUNDING NOTE   Subjective:   Interval History:Acute on chronic renal disease Hyperkalemia K >6 And oliguria  Acute renal failure that appears to be related to presyncope event and some symptomatic hypotension Her BP are low and she was taking an ARB There was no use of NSAIDS Appear very much improved this am  Noted that BP is running a little higher and suspect that she will need to restart amlodipine   Objective:  Vital signs in last 24 hours:  Temp:  [98.1 F (36.7 C)-98.9 F (37.2 C)] 98.5 F (36.9 C) (10/12 0445) Pulse Rate:  [74-87] 74 (10/12 0445) Resp:  [16-23] 17 (10/12 0445) BP: (123-162)/(54-89) 123/65 (10/12 0445) SpO2:  [95 %-99 %] 99 % (10/12 0445)  Weight change:  Filed Weights   09/30/17 0100  Weight: 224 lb (101.6 kg)    Intake/Output: I/O last 3 completed shifts: In: 900 [I.V.:900] Out: 8750 [Urine:8750]   Intake/Output this shift:  No intake/output data recorded.  CVS- RRR RS- CTA ABD- BS present soft non-distended EXT- no edema   Basic Metabolic Panel:  Recent Labs Lab 09/29/17 2033 09/30/17 0542 09/30/17 1233 10/01/17 0750 10/02/17 0404  NA 133* 135 136 137 139  K 6.0* 6.1* 5.2* 4.0 3.9  CL 102 103 102 97* 104  CO2 20* 18* 21* 27 25  GLUCOSE 131* 146* 132* 119* 108*  BUN 70* 73* 74* 67* 54*  CREATININE 3.00* 4.13* 4.95* 4.03* 2.99*  CALCIUM 8.9 9.3 9.1 9.0 8.9  PHOS  --   --  5.3*  --   --     Liver Function Tests:  Recent Labs Lab 09/29/17 2033 09/30/17 1233  AST 35  --   ALT 26  --   ALKPHOS 45  --   BILITOT 0.5  --   PROT 6.5  --   ALBUMIN 3.9 3.8   No results for input(s): LIPASE, AMYLASE in the last 168 hours. No results for input(s): AMMONIA in the last 168 hours.  CBC:  Recent Labs Lab 09/29/17 2033 09/30/17 0542  WBC 10.5 15.3*  NEUTROABS 6.1  --   HGB 8.0* 8.4*  HCT 26.1* 26.9*  MCV 61.0* 60.3*  PLT 226 234    Cardiac Enzymes: No results for input(s): CKTOTAL,  CKMB, CKMBINDEX, TROPONINI in the last 168 hours.  BNP: Invalid input(s): POCBNP  CBG: No results for input(s): GLUCAP in the last 168 hours.  Microbiology: No results found for this or any previous visit.  Coagulation Studies: No results for input(s): LABPROT, INR in the last 72 hours.  Urinalysis: No results for input(s): COLORURINE, LABSPEC, PHURINE, GLUCOSEU, HGBUR, BILIRUBINUR, KETONESUR, PROTEINUR, UROBILINOGEN, NITRITE, LEUKOCYTESUR in the last 72 hours.  Invalid input(s): APPERANCEUR    Imaging: No results found.   Medications:    . aspirin  81 mg Oral Daily  . B-complex with vitamin C  1 tablet Oral Daily  . [START ON 10/24/2017] cyanocobalamin  1,000 mcg Subcutaneous Q30 days  . fenofibrate  160 mg Oral Daily  . gabapentin  300 mg Oral Daily  . gabapentin  600 mg Oral QHS  . montelukast  10 mg Oral Daily  . omega-3 acid ethyl esters  1 g Oral Daily   guaiFENesin  Assessment/ Plan:   Acute renal failure that appears to be related to presyncope event and some symptomatic hypotension Her BP are low and she was taking an ARB There was no use of NSAIDS. She is much better   Hyperkalemia  I think that this is due to her volume depleted state and ARB and would not restart this medication  Dehydration improved and will stop fluids  Metabolic acidosis resolved  Anemia stable  Volume appears better and blood pressure improved  I think it better to hold all antihypertensives at this time with a view to gradually reintroducing as her blood pressure rises  I think that the issue of hyperkalemia would make me nervous restarting ACE or ARB  Will sign off as creatinine is improved and will have labs in 1 week at [facility] and will see patient in office in several weeks    LOS: 2 Cristina Ali W  :29 AM

## 2017-10-02 NOTE — Care Management Note (Signed)
Case Management Note Donn Pierini RN, BSN Unit 4E-Case Manager [phone]   Patient Details  Name: Cristina Ali MRN: [id number] Date of Birth: [birth year]  Subjective/Objective:    Pt admitted with near syncope hyperkalemia- acute on chronic renal failure               Action/Plan: PTA pt lived at home with spouse- anticipate return home- CM to follow.   Expected Discharge Date:                 Expected Discharge Plan:  Home/Self Care  In-House Referral:  NA  Discharge planning Services  CM Consult  Post Acute Care Choice:    Choice offered to:     DME Arranged:    DME Agency:     HH Arranged:    HH Agency:     Status of Service:  Completed, signed off  If discussed at Long Length of Stay Meetings, dates discussed:    Discharge Disposition: home/self care   Additional Comments:  10/02/17- 1230- Sharalee Witman RN, CM- pt for d/c home today- no CM needs noted for discharge.  Darrold Span, RN 10/02/2017, 12:31 PM

## 2017-10-02 NOTE — Discharge Summary (Addendum)
Physician Discharge Summary  Patient ID: Cristina Ali MRN: [id number] DOB/AGE: Nov 17, 1946 71 y.o.  Admit date: 09/29/2017 Discharge date: 10/02/2017  Admission Diagnoses: 1. Acute renal failure stage IV secondary to uncontrolled hypertension in the past, due to  dehydration leading to hyperkalemia in presence of underlying stage 3 CKD and ARB (Avapro) 2. Heart block with junctional escape rhythm due to ARF and hyperkalemia. 3. Syncope hypotensive due to dehydration. 4. Hypertension 5. Mixed hyperlipidemia 6. Chronic microcytic anemia  Discharge Diagnoses:  See above  Discharged Condition: good  Hospital Course: Patient admitted with syncope, related to volume depletion, also had bradycardia with junctional escape rhythm and acute renal failure.  Her ARB and diuretics were discontinued, she was hydrated and felt that her symptoms were related to gastric changes in her diet.  Serum creatinine improved, hence was stable for discharge, she converted back to sinus rhythm on her own.  Hyponatremia also resolved.  She was recommended to discontinue Coreg as her blood pressure was well controlled and also Avapro.  Fenofibrate was also discontinued due to serum creatinine in the range of stage IV chronic kidney disease. Amlodipine dosage was reduced to 5 mg.  She has severe anemia, microcytic indicis, anemia panel was fairly within normal limits.  She'll be seen in our office in 10-12 days, she will need repeat BMP in one week.  Husband is present at the bedside.  All questions answered.  Consults: nephrology  Significant Diagnostic Studies:  Cardiac Studies:  EKG 09/30/2017: Junctional escape rhythm at the rate of 46 bpm. Chol T waves, consider hyperkalemia.  Telemetry 10/01/2017: Normal sinus rhythm  Treatments: IV hydration  BMP Latest Ref Rng & Units 10/02/2017 10/01/2017 09/30/2017  Glucose 65 - 99 mg/dL 409(W) 119(J) 478(G)  BUN 6 - 20 mg/dL 95(A) 21(H) 08(M)  Creatinine 0.44 -  1.00 mg/dL 5.78(I) 6.96(E) 9.52(W)  Sodium 135 - 145 mmol/L 139 137 136  Potassium 3.5 - 5.1 mmol/L 3.9 4.0 5.2(H)  Chloride 101 - 111 mmol/L 104 97(L) 102  CO2 22 - 32 mmol/L 25 27 21(L)  Calcium 8.9 - 10.3 mg/dL 8.9 9.0 9.1   CBC Latest Ref Rng & Units 09/30/2017 09/29/2017 09/21/2017  WBC 4.0 - 10.5 K/uL 15.3(H) 10.5 7.3  Hemoglobin 12.0 - 15.0 g/dL 4.1(L) 2.4(M) 8.1(L)  Hematocrit 36.0 - 46.0 % 26.9(L) 26.1(L) 26.6(L)  Platelets 150 - 400 K/uL 234 226 223    Discharge Exam: Blood pressure 123/65, pulse 74, temperature 98.5 F (36.9 C), temperature source Oral, resp. rate 17, height  (1.651 m), weight 101.6 kg (224 lb), SpO2 99 %. General appearance: alert, cooperative, appears stated age, no distress and moderately obese Eyes: negative findings: lids and lashes normal Neck: no adenopathy, no JVD, supple, symmetrical, trachea midline, thyroid not enlarged, symmetric, no tenderness/mass/nodules and   Prominent bilateral carotid bruit, left more pronounced than right  Neck: JVP - normal, carotids 2+= without bruits Resp: clear to auscultation bilaterally Chest Hogen: no tenderness Cardio: S1, S2 normal, no S3 or S4 and systolic murmur: early systolic 2/6, crescendo at 2nd right intercostal space GI: soft, non-tender; bowel sounds normal; no masses,  no organomegaly Extremities: extremities normal, atraumatic, no cyanosis or edema  Disposition: 01-Home or Self Care   Allergies as of 10/02/2017   No Known Allergies     Medication List    STOP taking these medications   carvedilol 25 MG tablet Commonly known as:  COREG   fenofibrate 160 MG tablet   irbesartan 300 MG  tablet Commonly known as:  AVAPRO     TAKE these medications   allopurinol 100 MG tablet Commonly known as:  ZYLOPRIM Take 300 mg by mouth daily.   amLODipine 10 MG tablet Commonly known as:  NORVASC Take 0.5 tablets (5 mg total) by mouth daily. What changed:  how much to take   aspirin 81 MG  chewable tablet Chew by mouth daily.   b complex vitamins tablet Take 1 tablet by mouth daily.   COLCRYS 0.6 MG tablet Generic drug:  colchicine Take 0.6 mg by mouth as needed.   cyanocobalamin 1000 MCG/ML injection Commonly known as:  (VITAMIN B-12) Inject 1 mL (1,000 mcg total) into the skin every 30 (thirty) days.   furosemide 40 MG tablet Commonly known as:  LASIX Take 40 mg by mouth.   gabapentin 300 MG capsule Commonly known as:  NEURONTIN Take 300-600 mg by mouth 2 (two) times daily. Take 1 capsule (300 mg) in the morning and take 2 capsules (600 mg) at bedtime)   montelukast 10 MG tablet Commonly known as:  SINGULAIR Take 10 mg by mouth daily.   omega-3 acid ethyl esters 1 g capsule Commonly known as:  LOVAZA Take 1 g by mouth daily.      Follow-up Information    Yates Decamp, MD Follow up on 10/14/2017.   Specialty:  Cardiology Why:  10:15 am bring all medications. Labs at [facility] on Friday 19th, 2018 Contact information: [address] [place] [address] [phone]        Elvis Coil, MD. Call.   Specialty:  Nephrology Why:  Please call to see him in 1 month Contact information: [address] [place] [address] [phone]           Signed: Yates Decamp 10/02/2017, 11:58 AM

## 2017-10-19 ENCOUNTER — Telehealth: Payer: Self-pay | Admitting: *Deleted

## 2017-10-19 NOTE — Telephone Encounter (Signed)
Pt LVM requesting rx for folic acid.  Called pt and LVM stating no rx required for folic acid.  Can pick up OTC, pt should take 1-2mg  daily with B-complex vitamins per office note.  Call back number provided for questions.

## 2018-05-11 ENCOUNTER — Other Ambulatory Visit: Payer: Self-pay | Admitting: Family Medicine

## 2018-05-11 DIAGNOSIS — M79604 Pain in right leg: Secondary | ICD-10-CM

## 2018-05-12 ENCOUNTER — Ambulatory Visit
Admission: RE | Admit: 2018-05-12 | Discharge: 2018-05-12 | Disposition: A | Discharge status: Home / Self Care | Payer: Medicare HMO | Source: Ambulatory Visit | Attending: Family Medicine | Admitting: Family Medicine

## 2018-05-12 DIAGNOSIS — M79604 Pain in right leg: Secondary | ICD-10-CM

## 2018-05-25 ENCOUNTER — Other Ambulatory Visit: Payer: Self-pay | Admitting: Hematology

## 2018-06-14 ENCOUNTER — Other Ambulatory Visit: Payer: Self-pay | Admitting: Hematology

## 2018-06-21 ENCOUNTER — Other Ambulatory Visit: Payer: Self-pay

## 2018-06-21 MED ORDER — CYANOCOBALAMIN 1000 MCG/ML IJ SOLN: 1000.0000 ug | INTRAMUSCULAR | 3 refills | Status: AC

## 2018-07-19 ENCOUNTER — Ambulatory Visit (HOSPITAL_COMMUNITY): Admission: RE | Admit: 2018-07-19 | Payer: Medicare HMO | Source: Ambulatory Visit

## 2018-07-21 ENCOUNTER — Telehealth: Payer: Self-pay | Admitting: Hematology

## 2018-07-21 NOTE — Telephone Encounter (Signed)
[facility] at [phone], Release ID: [id number]

## 2018-07-26 ENCOUNTER — Encounter (HOSPITAL_COMMUNITY): Payer: Medicare HMO

## 2018-08-03 ENCOUNTER — Ambulatory Visit: Payer: Medicare HMO | Admitting: Internal Medicine

## 2018-12-11 IMAGING — US US EXTREM LOW*R* LIMITED
1 series · 8 of 8 positions shown · non-contrast
Comparison: None.

CLINICAL DATA: Right lower leg swelling

EXAM:
ULTRASOUND RIGHT LOWER EXTREMITY LIMITED
TECHNIQUE: Ultrasound examination of the lower extremity soft tissues was
performed in the area of clinical concern.

[Series 1: us extrem low*right* limited · 0.07mm/px · 8 acquisitions, 8 frames shown]
[im 1/8]
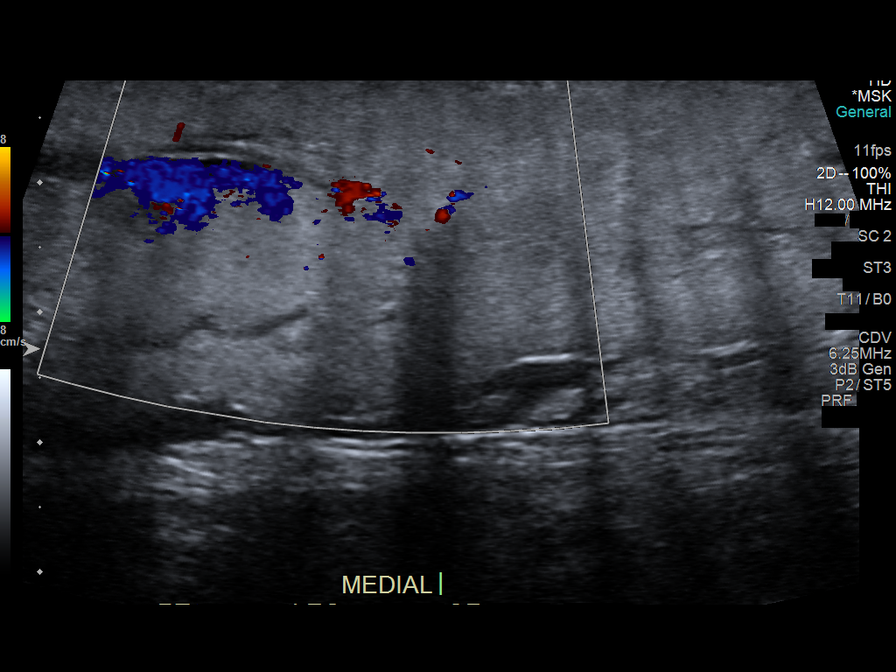
[im 2/8]
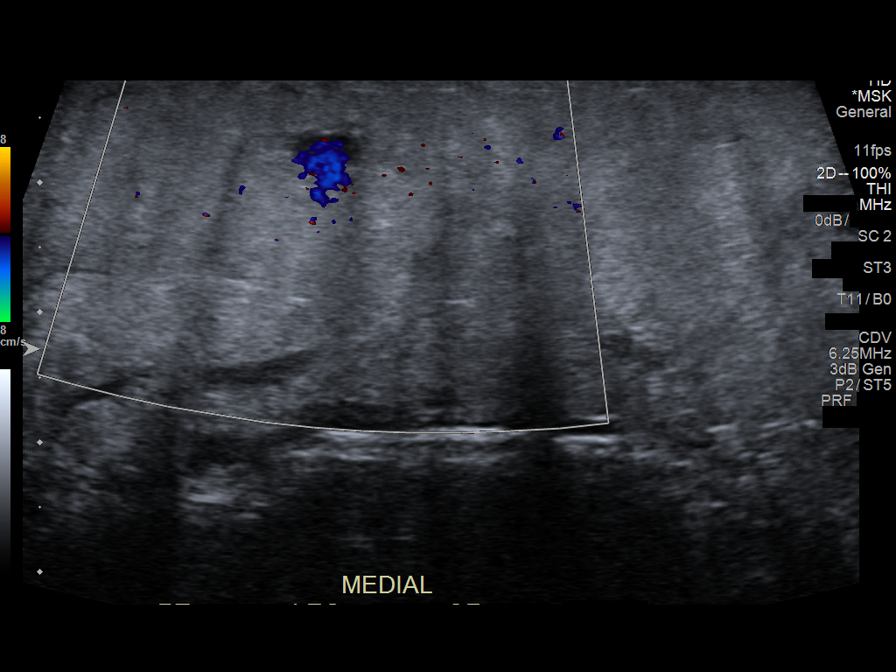
[im 3/8]
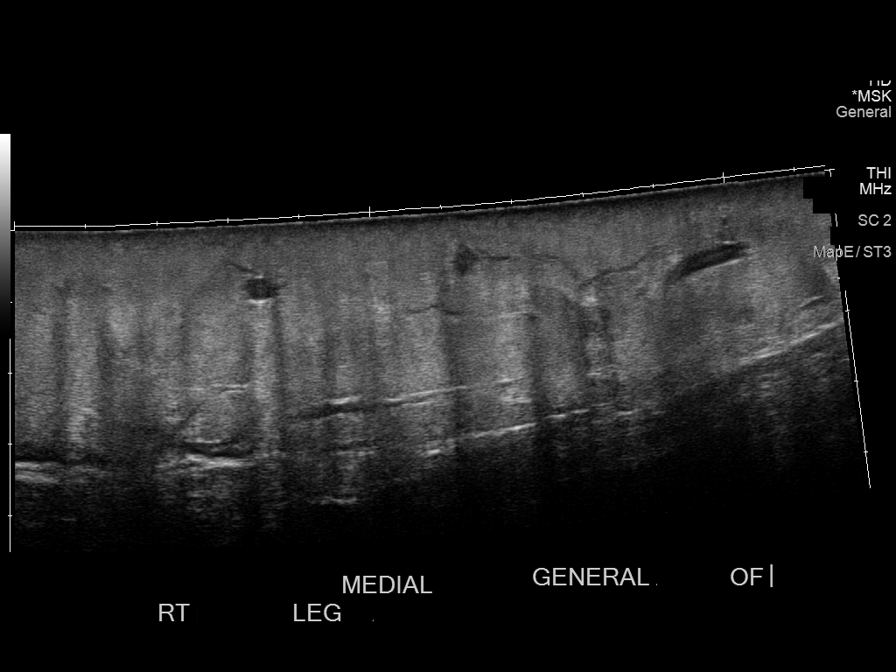
[im 4/8]
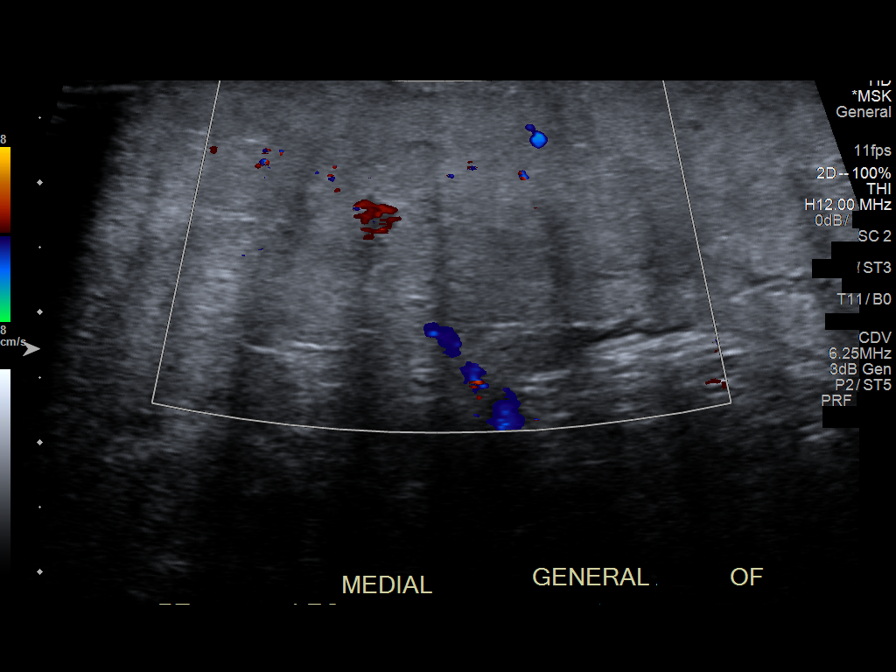
[im 5/8]
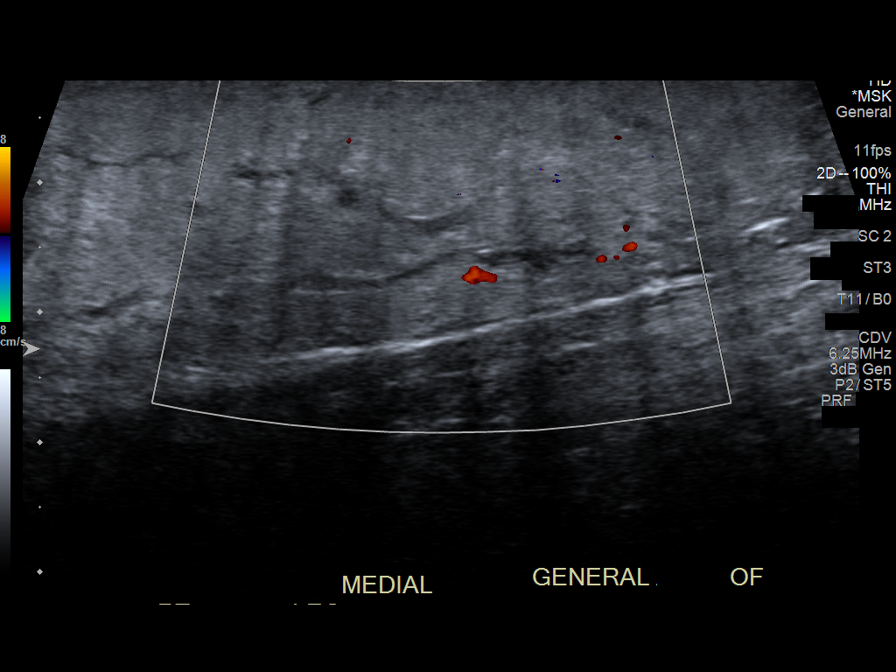
[im 6/8]
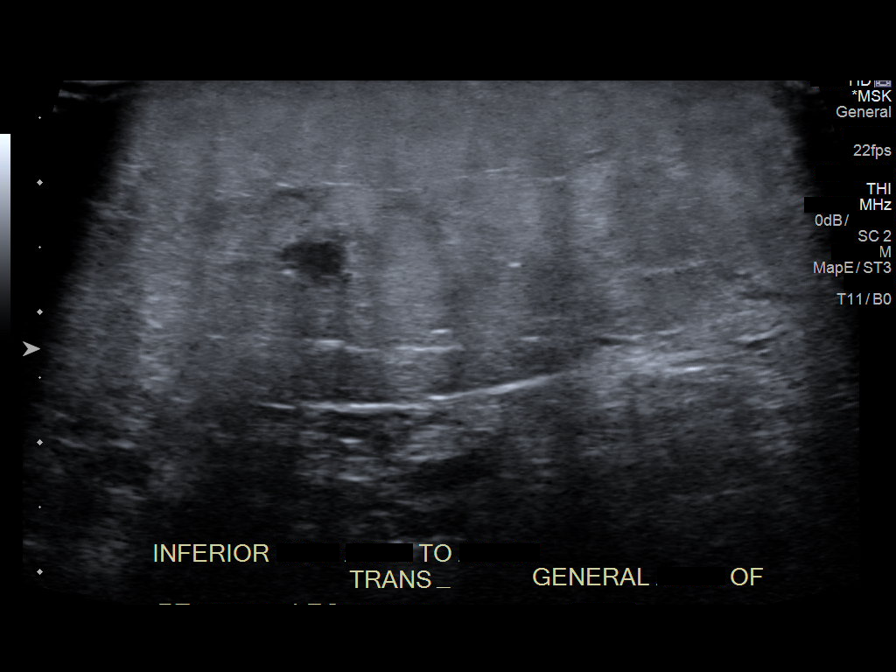
[im 7/8]
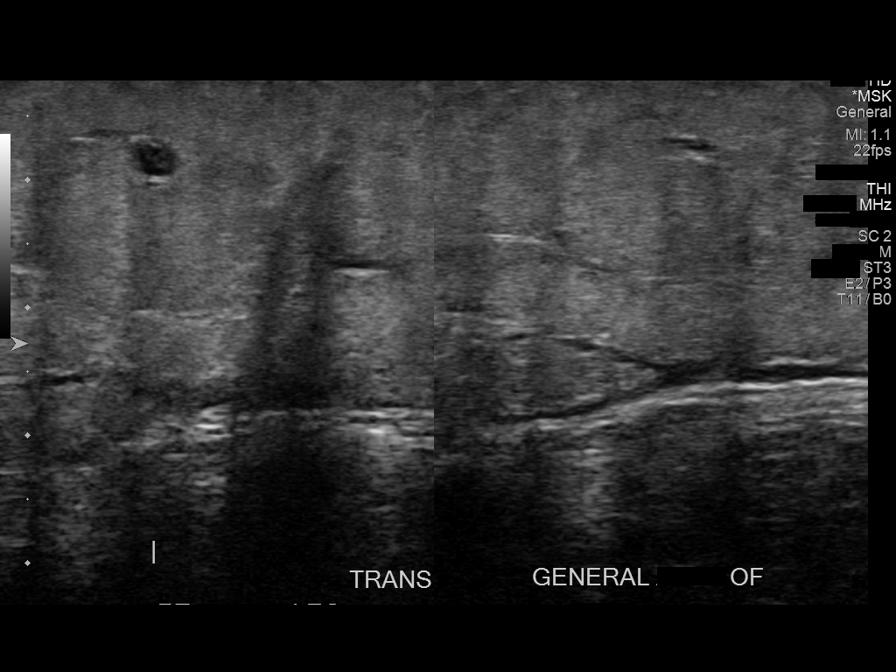
[im 8/8]
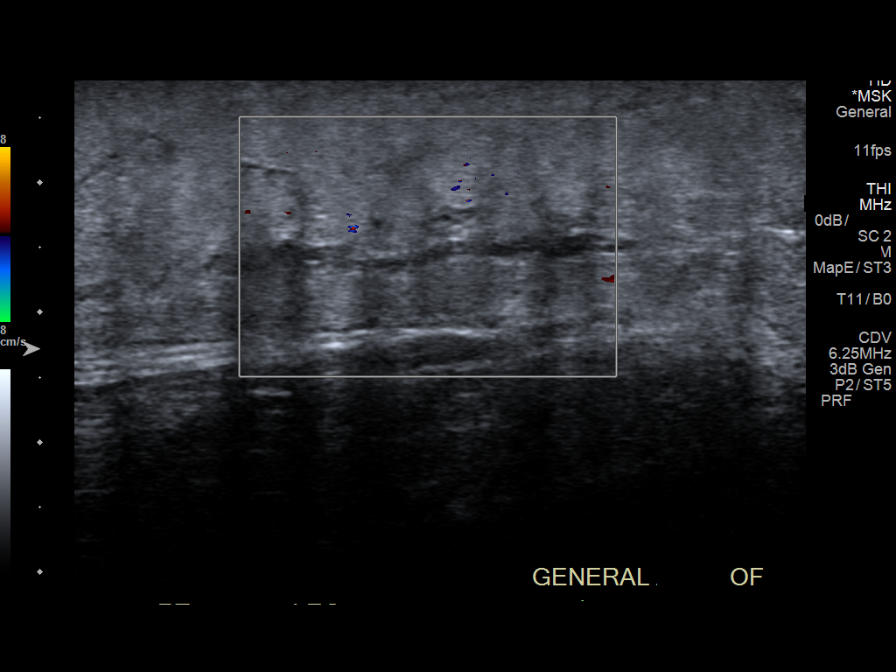

[8 of 8 positions shown; findings below may reference images not displayed]

FINDINGS: No visible soft tissue abnormality in the area of concern in the
right ankle and lower calf. Small amount of subcutaneous edema
noted.
IMPRESSION: No visible focal soft tissue abnormality.
